# Patient Record
Sex: Female | Born: 1970 | Race: White | Hispanic: No | Marital: Married
Health system: Midwestern US, Academic
[De-identification: ages and names within clinical notes are randomized; demographics above are authoritative.]

---

## 2016-01-25 IMAGING — CR LOW_EXM
2 series · 2 of 2 positions shown · non-contrast
Comparison: none

[foot]
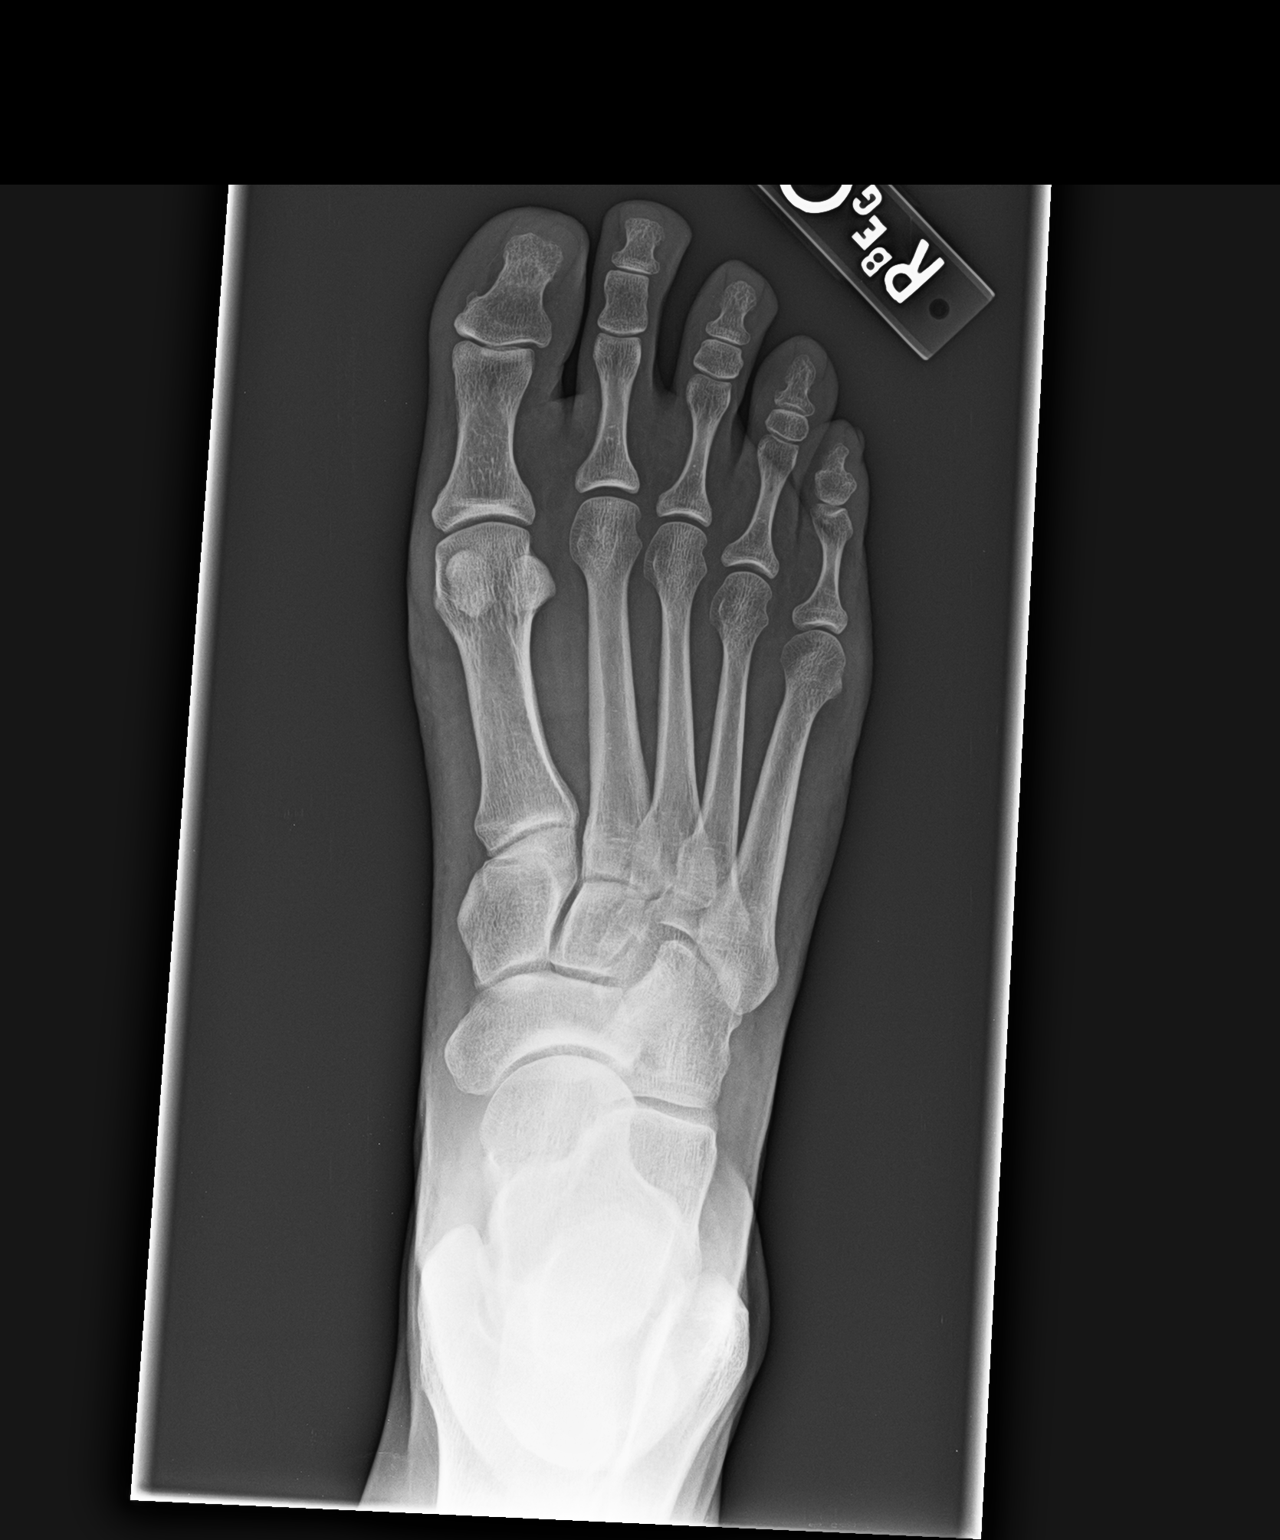

[foot lat]
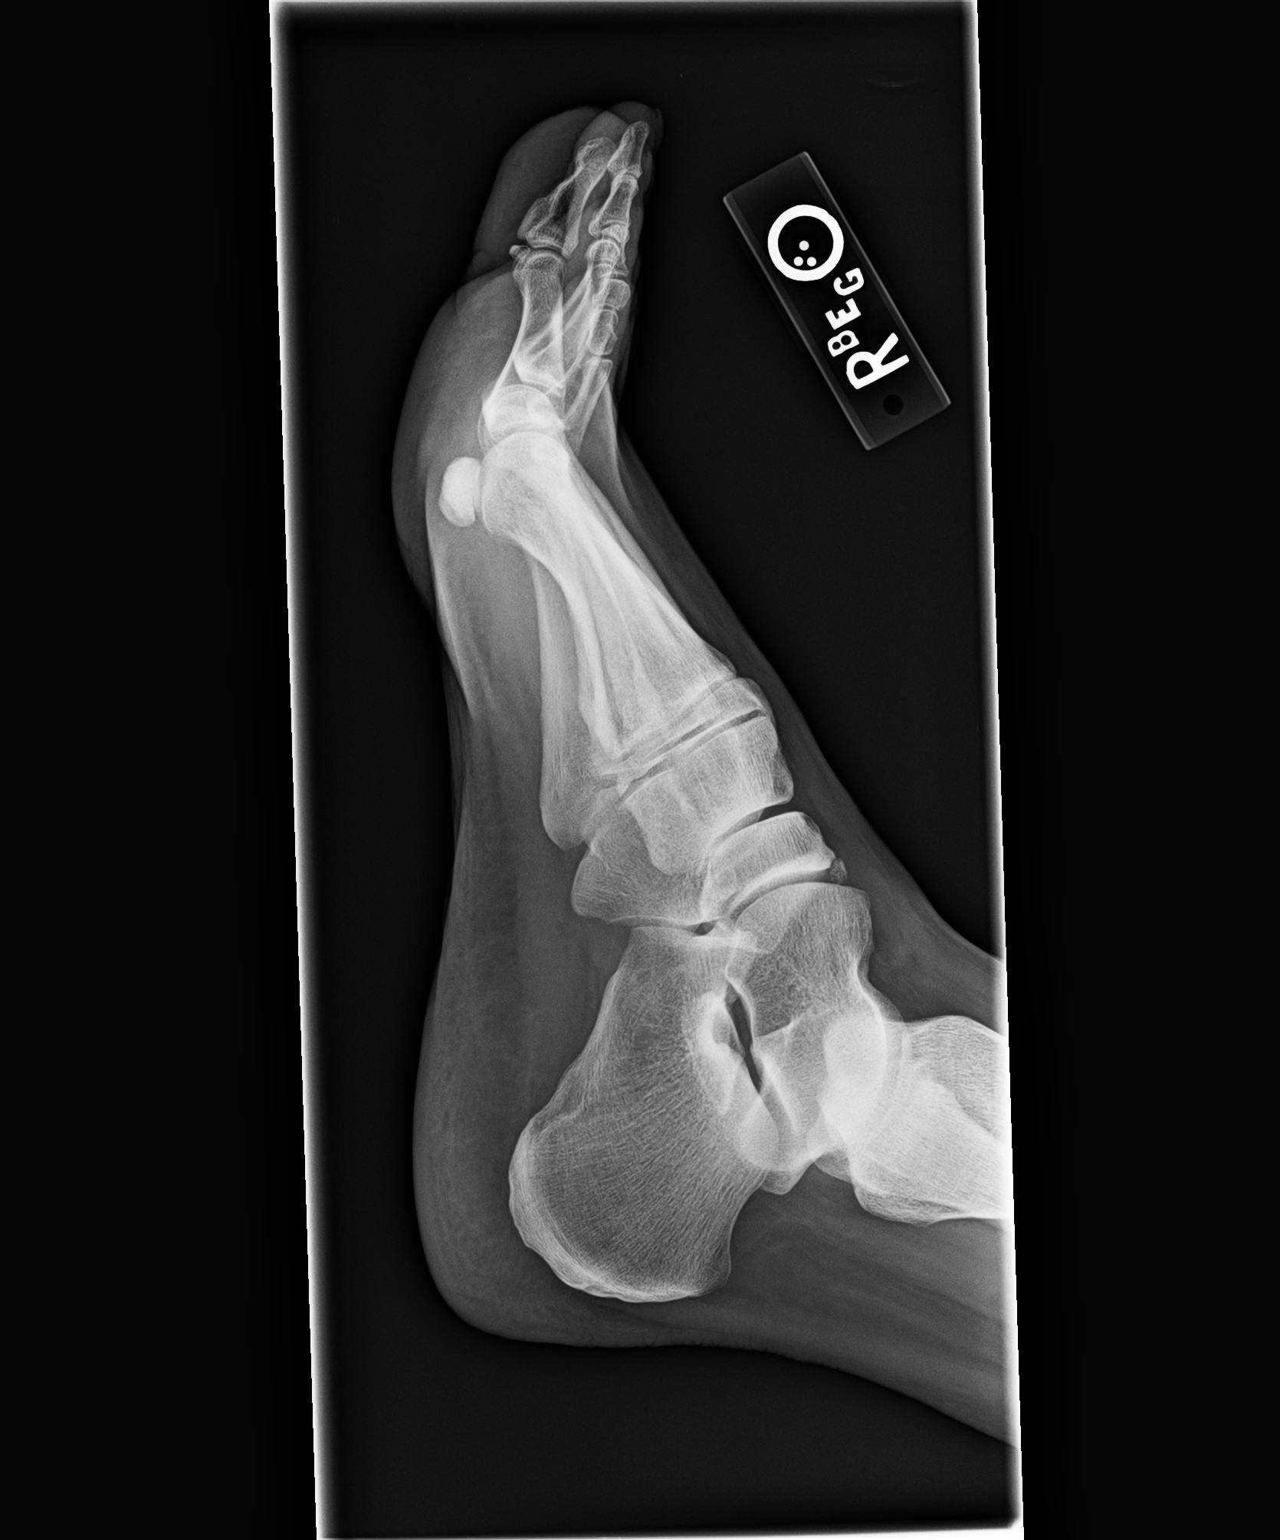

[2 of 2 positions shown; findings below may reference images not displayed]

DIAGNOSTIC STUDIES

EXAM

TWO VIEW RIGHT FOOT

INDICATION

RT FOOT PAIN, RAN OVER WITH JHANDEL 3 WKS AGO
Pain lateral aspect of foot after having it ran over by Irish Barba 2 weeks
ago. Bruising along 5th metatarsal region.
BG

TECHNIQUE

AP and lateral right foot views

COMPARISONS

None

FINDINGS

No dislocation nor fracture is identified. The bony structures have a normal appearance.

IMPRESSION

Two views of the right foot demonstrate no acute bony injury.

## 2016-02-27 IMAGING — CR LOW_EXM
3 series · 3 of 3 positions shown · non-contrast
Comparison: none

[foot]
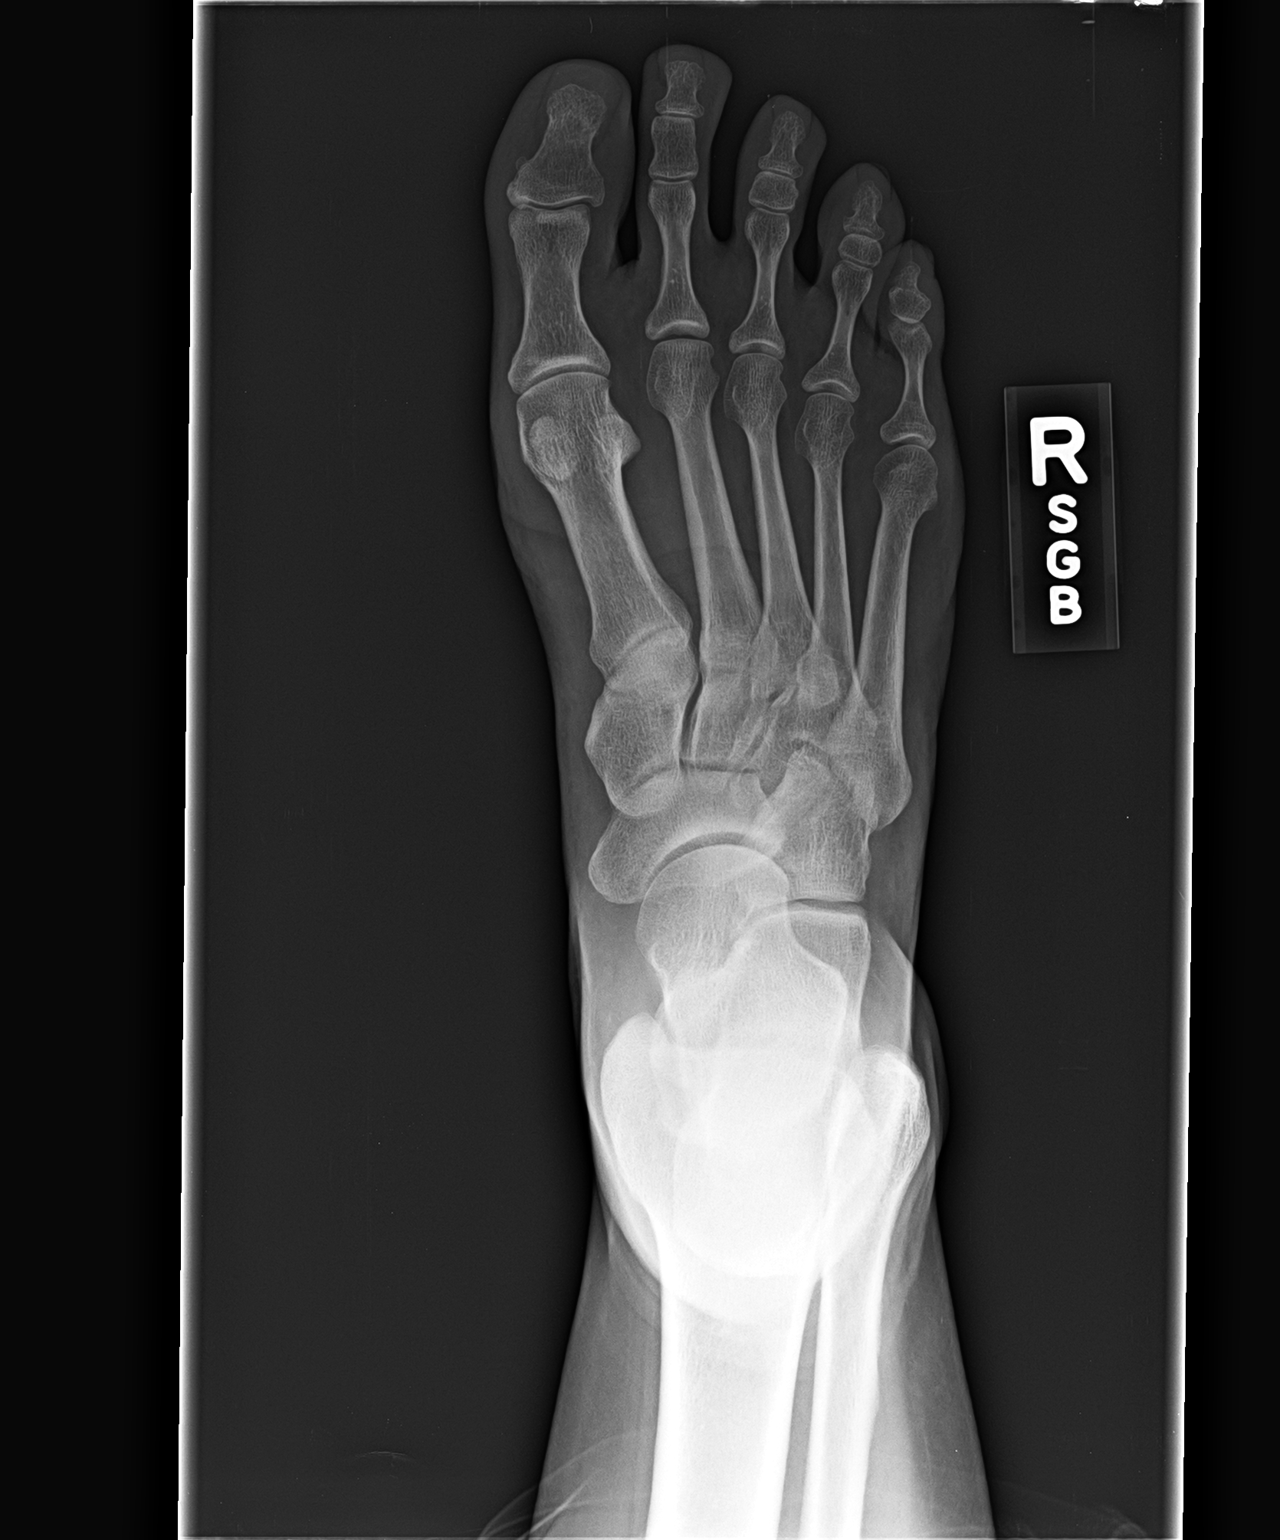

[foot obl]
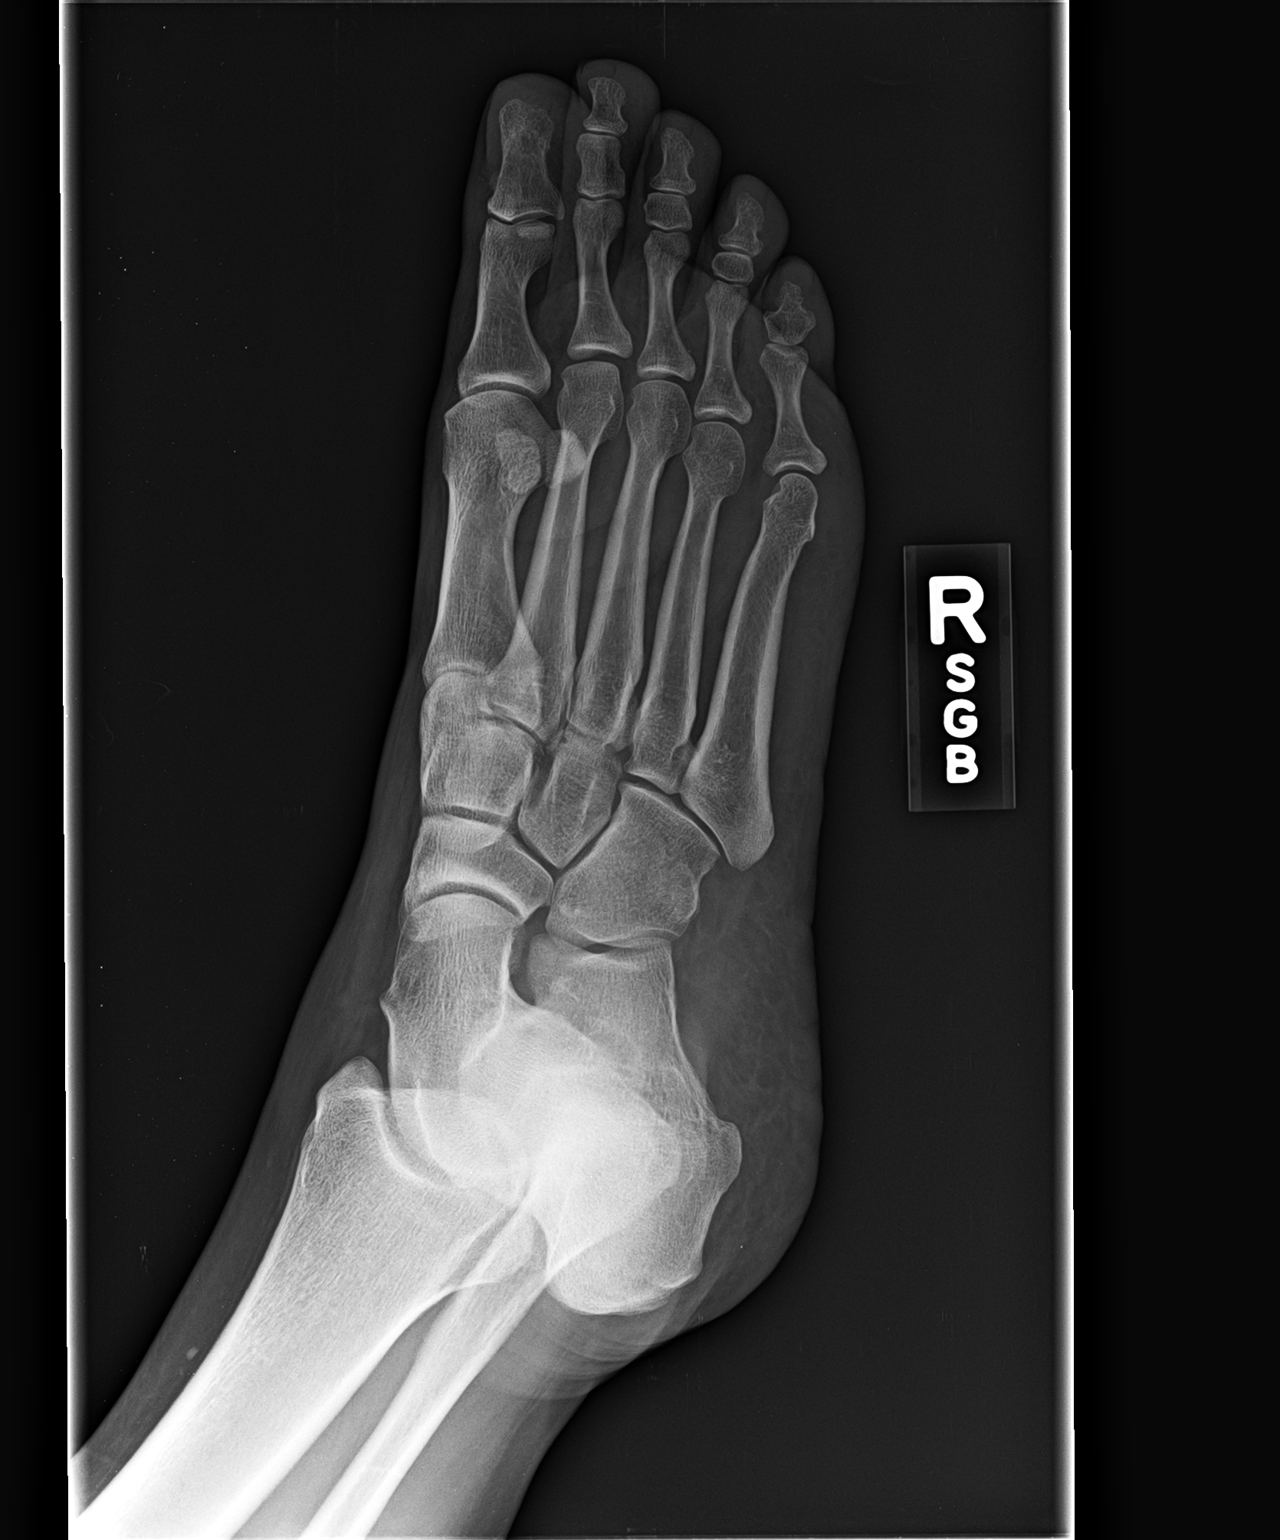

[foot lat]
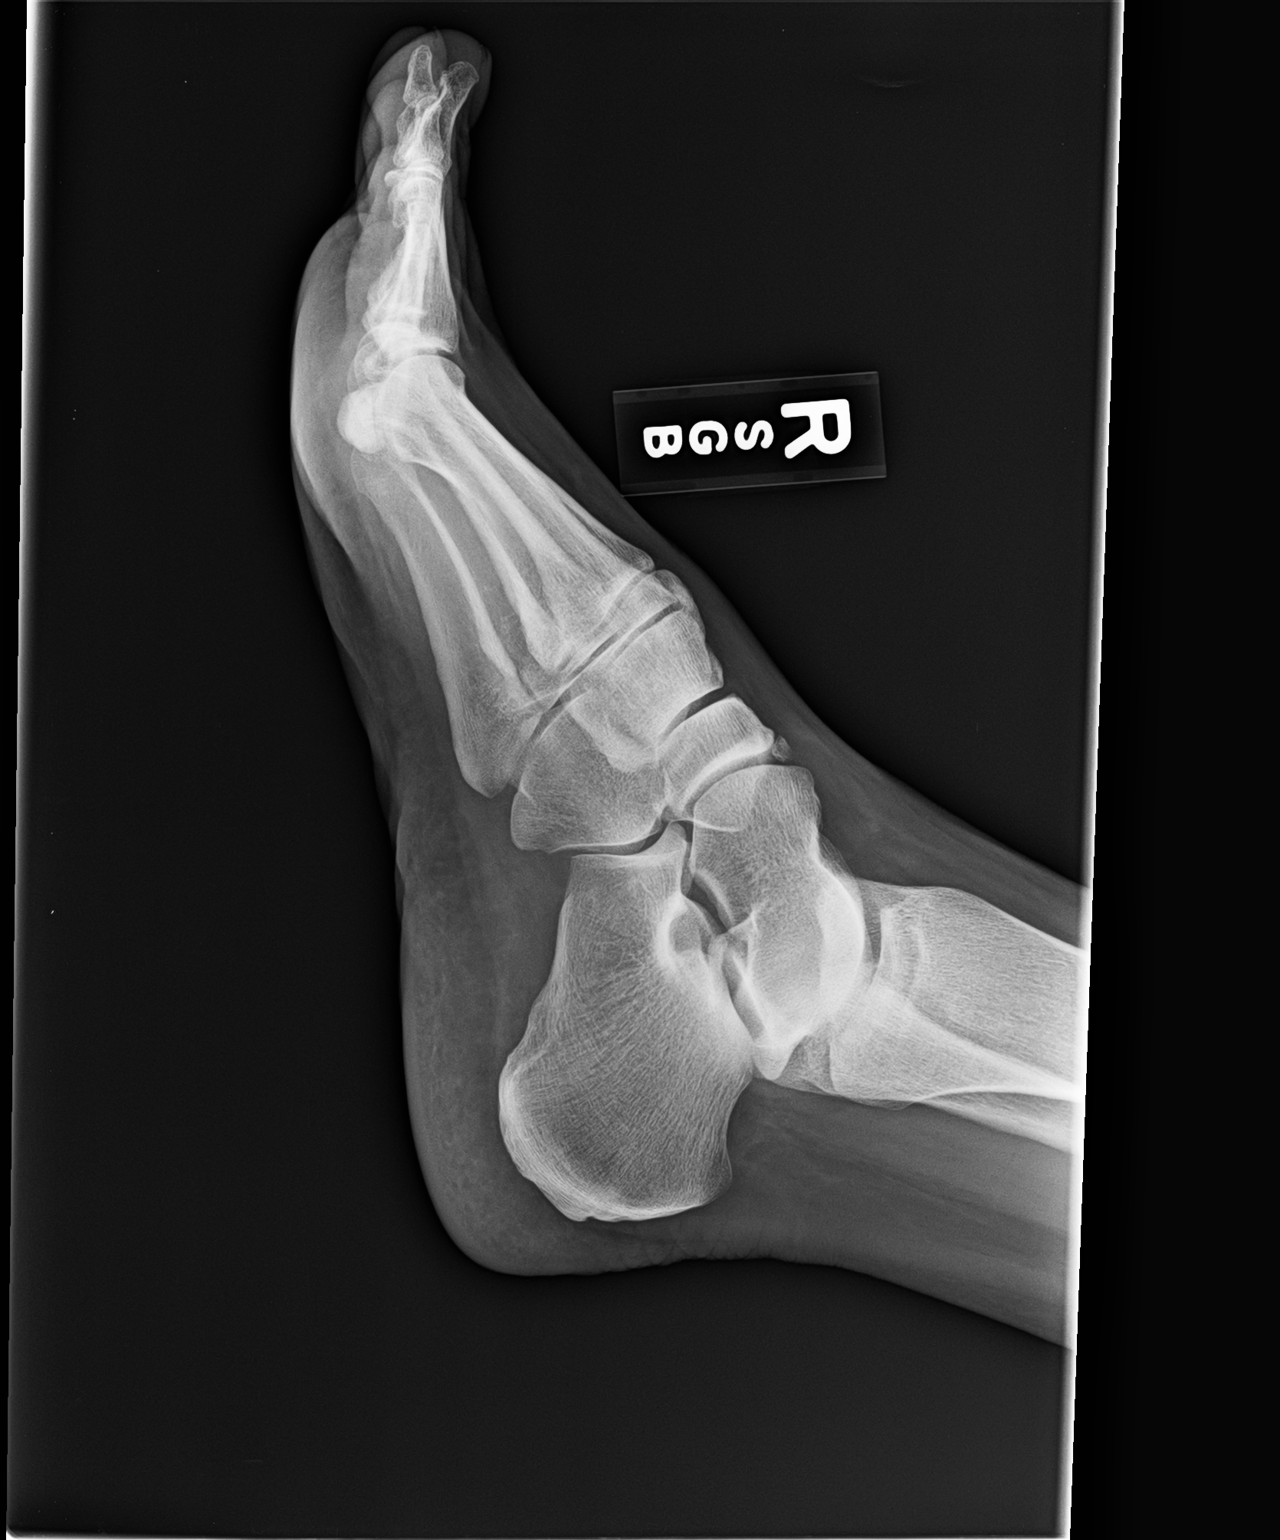

[3 of 3 positions shown; findings below may reference images not displayed]

EXAM
3 views right foot

INDICATION
DROPPED HEQUET ON FOOT 01/07/16
DROPPED GOMILSON KESONGO ON FOOT 01/07/16. PAIN. SB/ME

TECHNIQUE
AP, lateral, and oblique views were obtained

COMPARISONS
None available.

FINDINGS
There is no acute fracture, dislocation, or other acute osseus abnormality. The soft tissues are
unremarkable.

IMPRESSION
1. No acute osseus abnormality.

## 2016-10-31 IMAGING — CR ABDOMEN
2 series · 2 of 2 positions shown · non-contrast
Comparison: none

[abdomen upright]
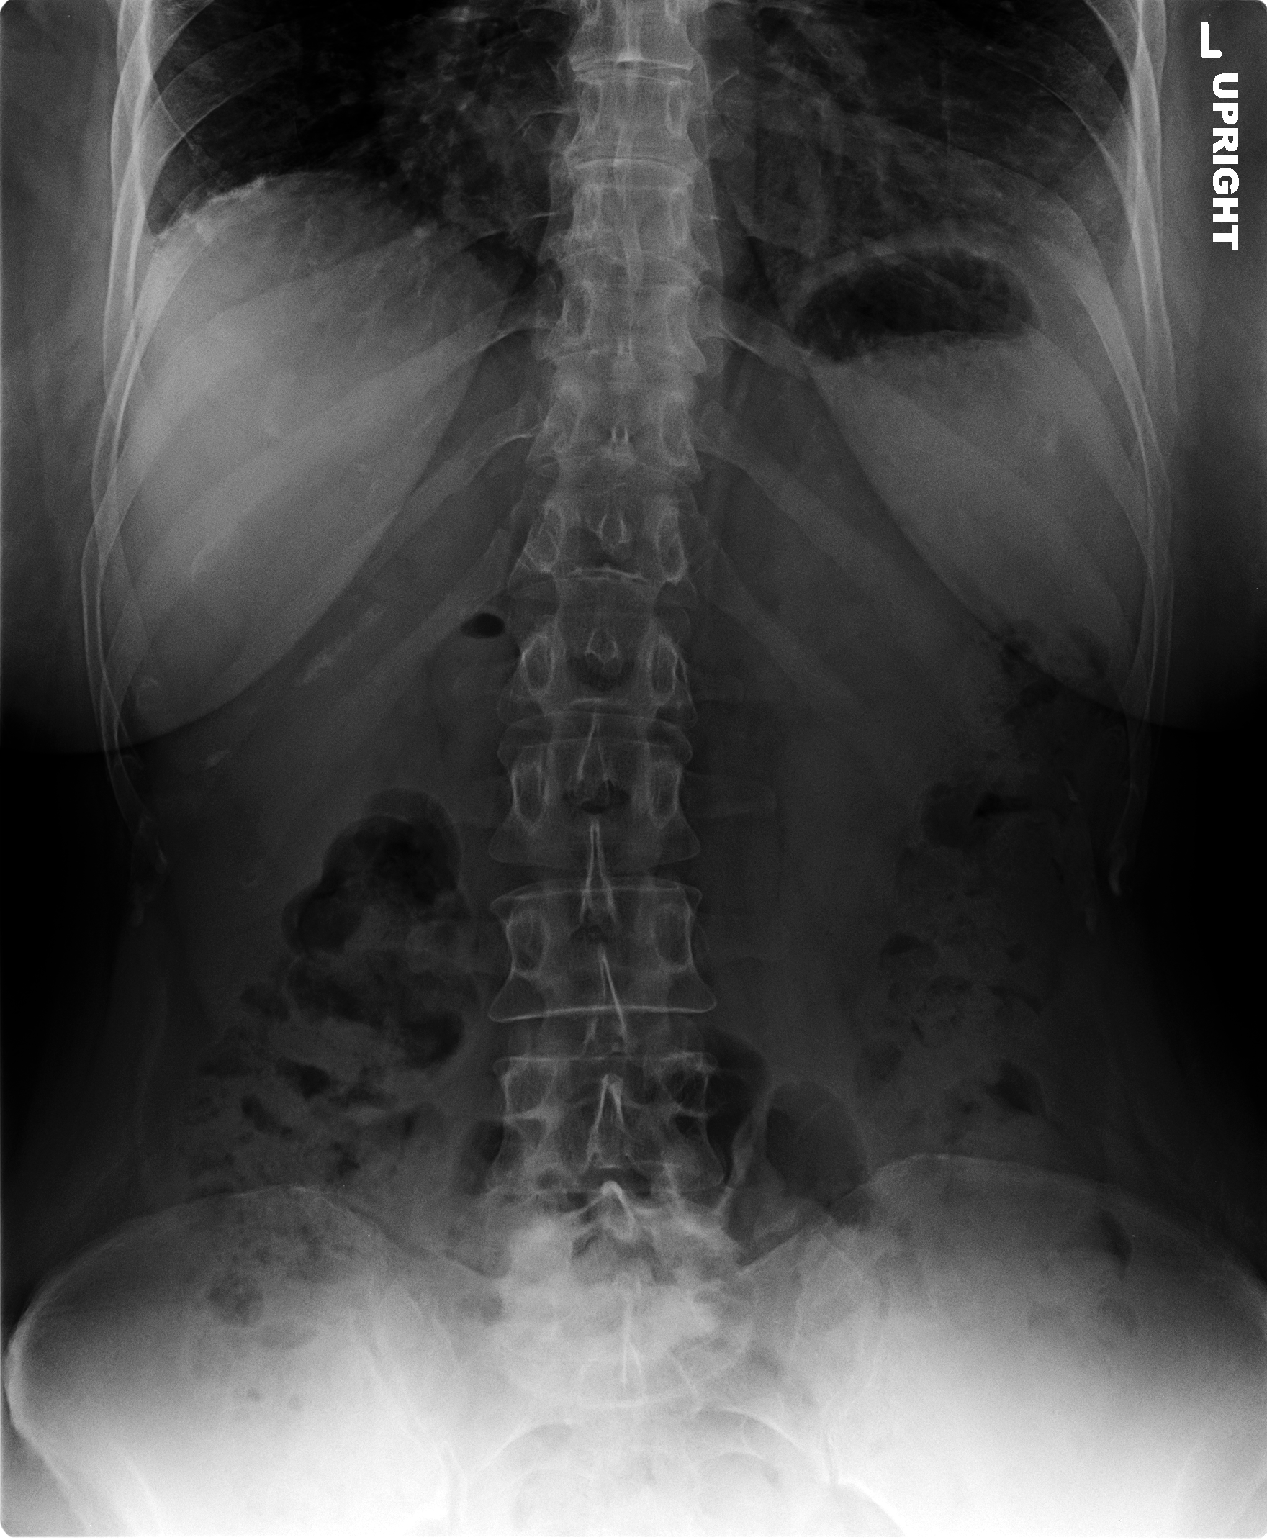

[abdomen supine kub]
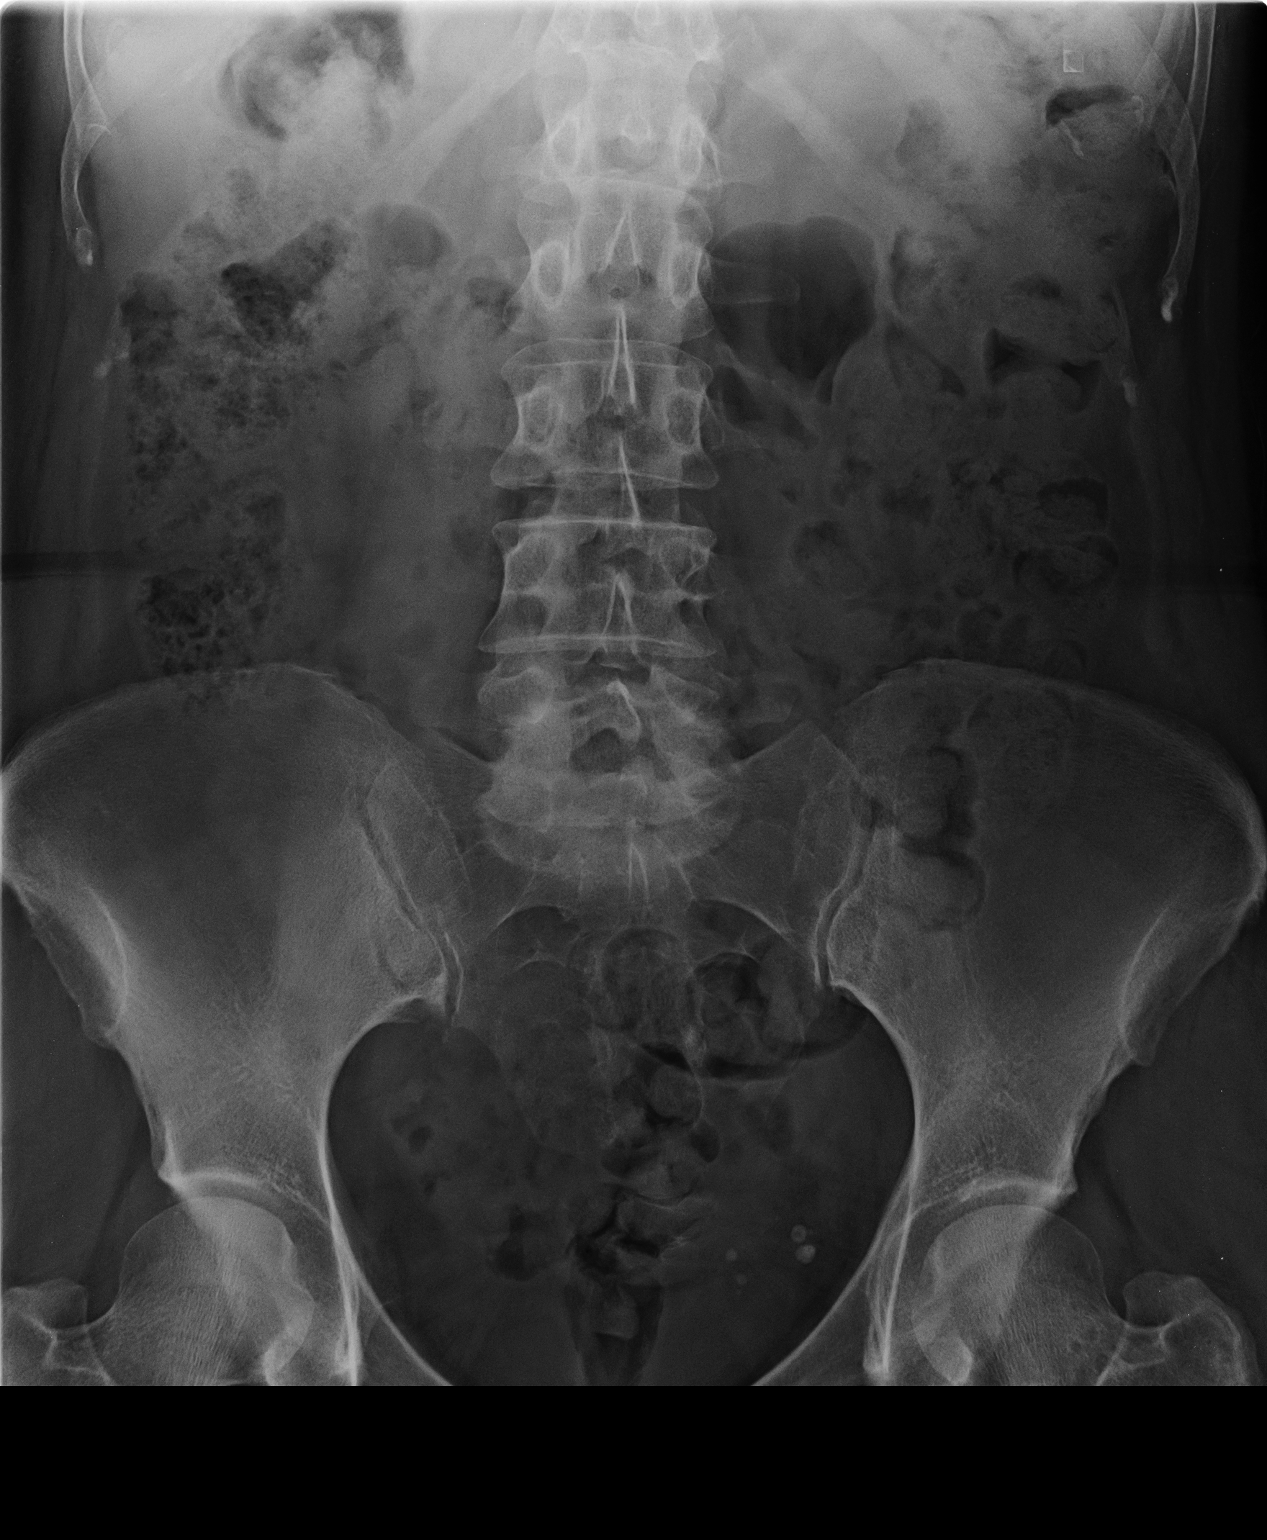

[2 of 2 positions shown; findings below may reference images not displayed]

DIAGNOSTIC STUDIES

EXAM

TWO-VIEW ABDOMEN

INDICATION

constipation, bloating
ABD PAIN, CRAMPING X 2 WEEKS, WORSE THIS WEEK. CONSTIPATION. PT STATES
BEGAN ALLERGY SHOTS FOR FOOD ALLERGIES 1 MO AGO. D/C THIS WEEK DUE TO ABD
PAIN. AB

TECHNIQUE

Upright and supine frontal radiographs of the abdomen and pelvis were performed.

COMPARISONS

None

FINDINGS

The lung bases are clear. No free intraperitoneal gas is detected. The bowel gas pattern is
nonspecific and nonobstructive with stool and gas overlying the colon and rectum. No abnormal
calcifications are seen. Large retained stool volume.

IMPRESSION

Nonspecific bowel gas pattern and no evidence of free intraperitoneal gas. Large retained stool
volume.

## 2018-02-04 IMAGING — MG MAMMOGRAM 3D SCREEN, BILATERAL
12 of 16 series · 12 of 16 positions shown · non-contrast
Comparison: none

[R CC (1 of 2)]
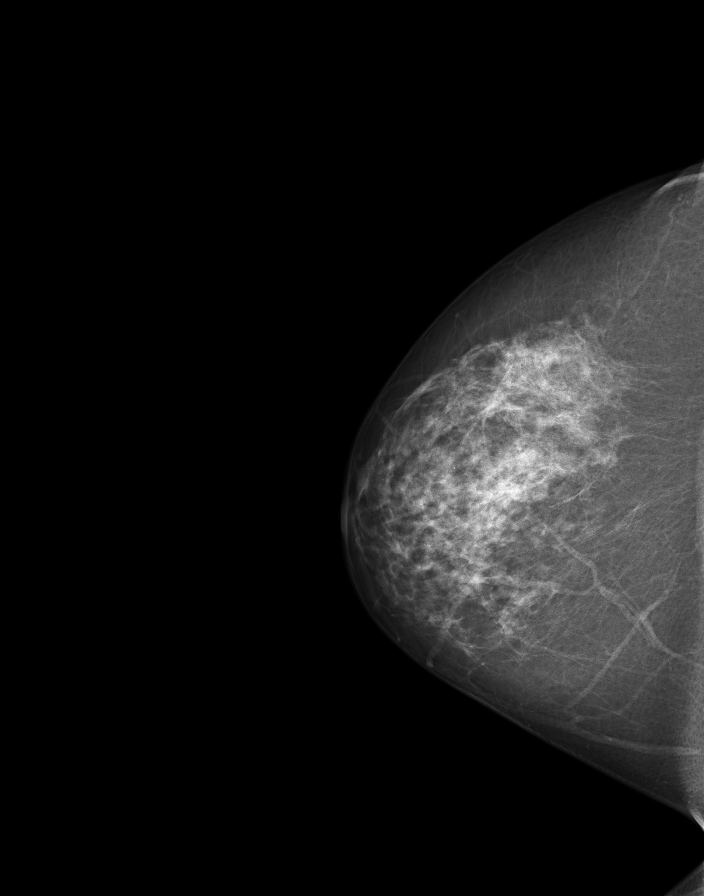

[R tomo (1 of 2)]
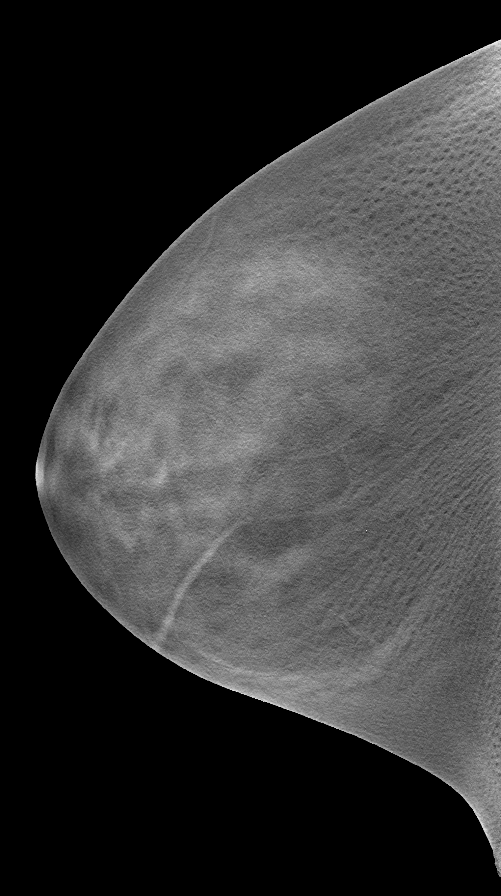

[R CC (2 of 2)]
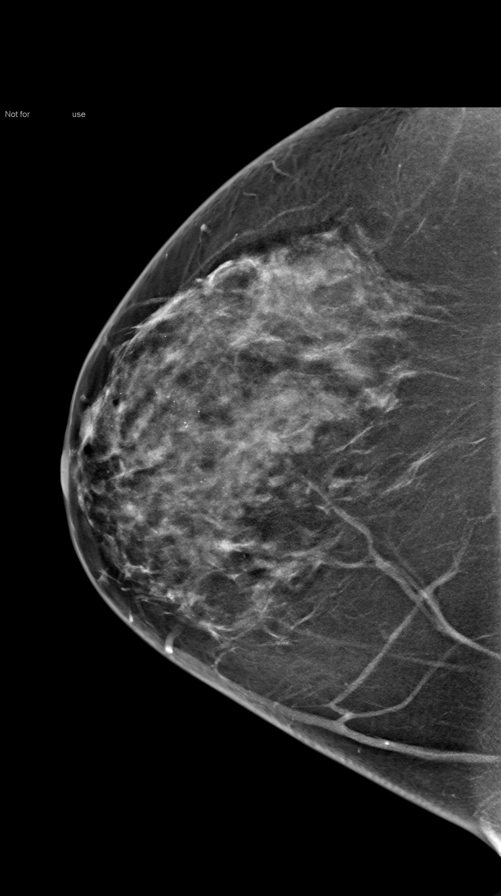

[R (1 of 2)]
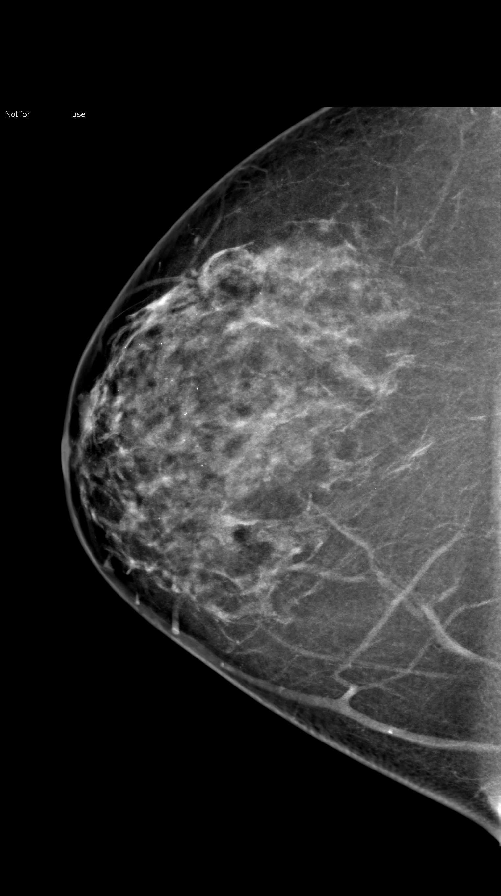

[L CC (1 of 2)]
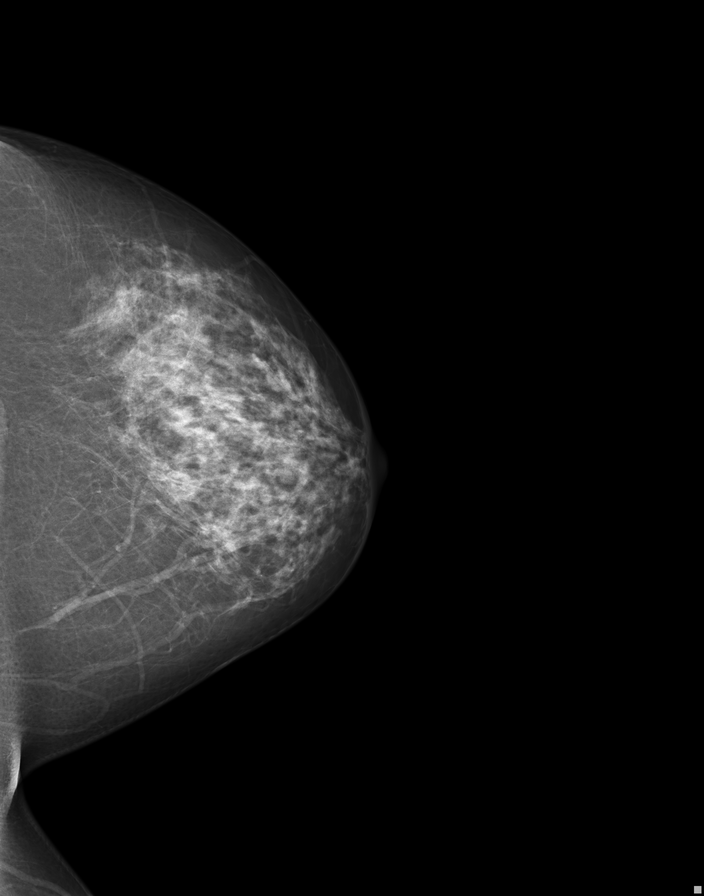

[L tomo]
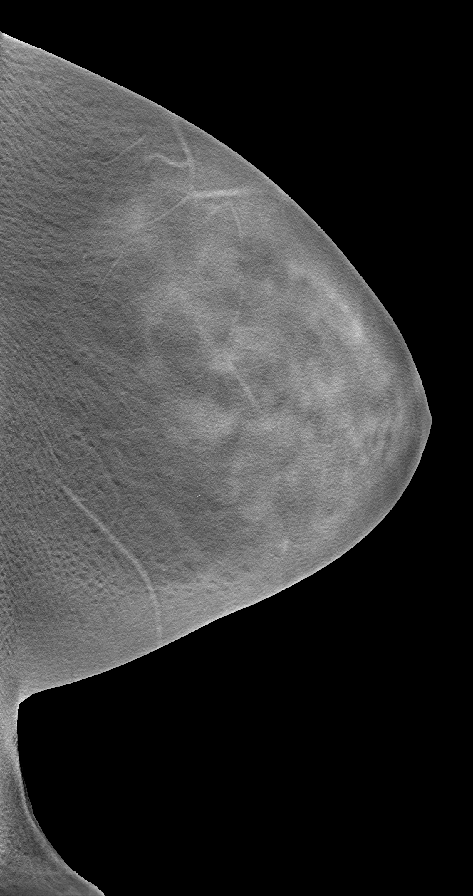

[L CC (2 of 2)]
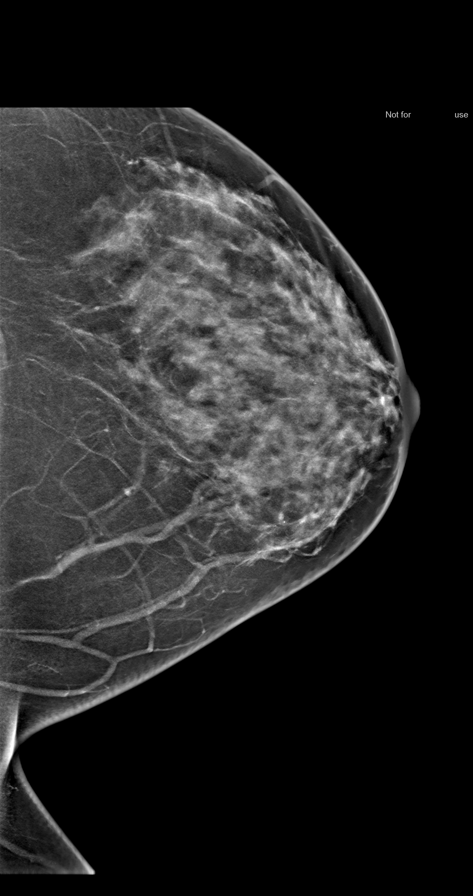

[L]
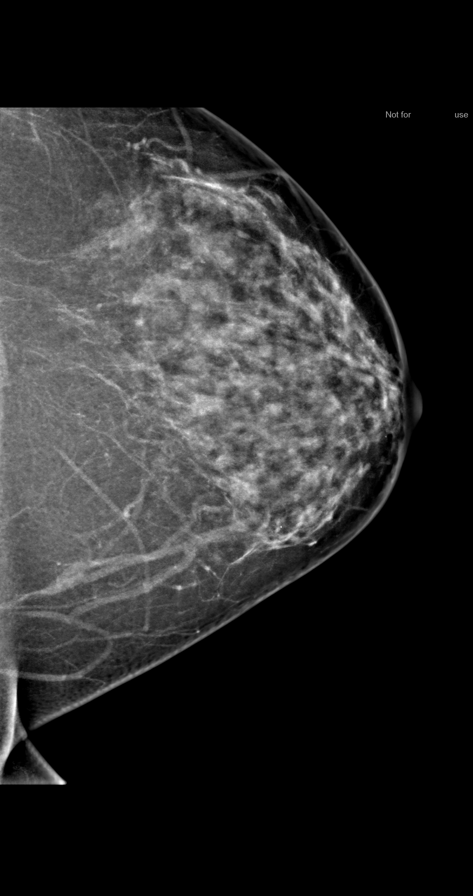

[R MLO (1 of 2)]
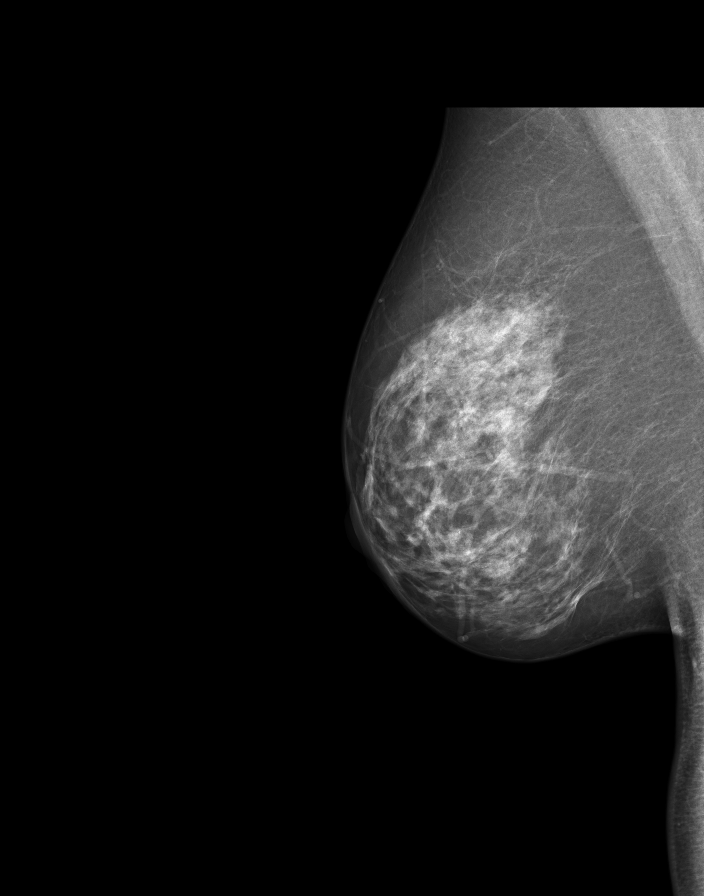

[R tomo (2 of 2)]
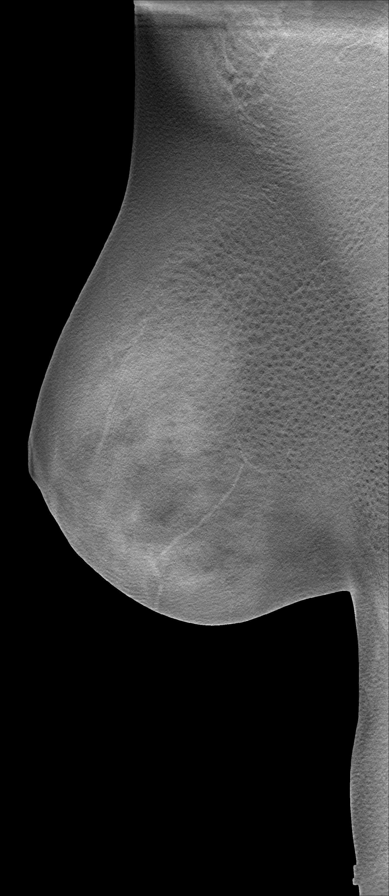

[R MLO (2 of 2)]
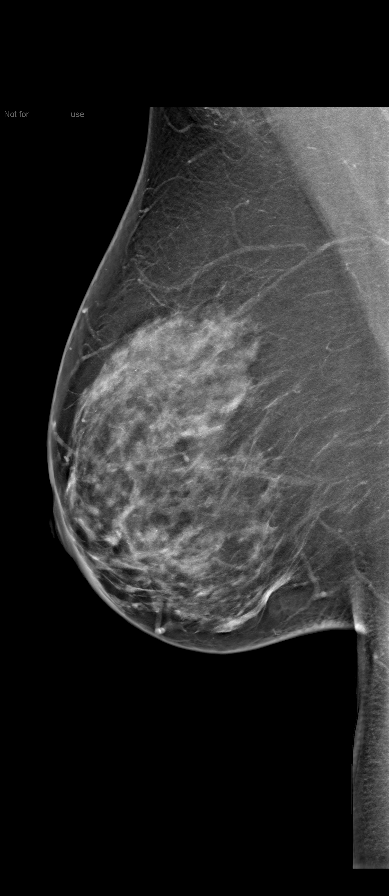

[R (2 of 2)]
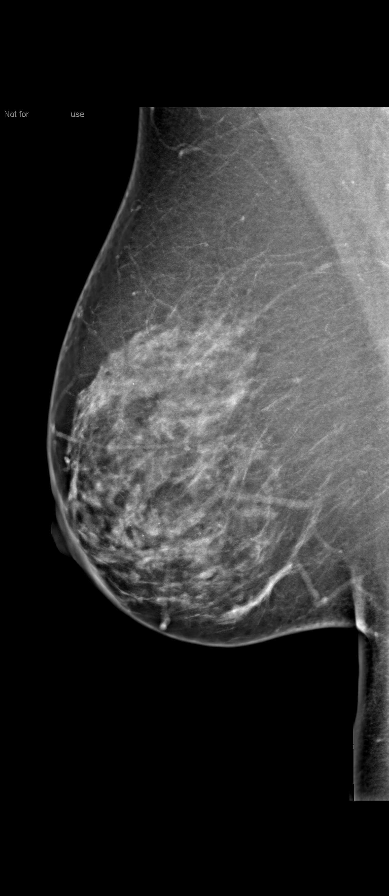

[12 of 16 positions shown; findings below may reference images not displayed]

EXAM
MM mammogram 3D screen bilat

INDICATION
Screening

TECHNIQUE
2D and 3D tomosynthesis digital craniocaudal and mediolateral oblique views were obtained of both
breasts. Computer aided detection software was utilized.

COMPARISONS
None available at the time of dictation.

FINDINGS
The breasts are heterogeneously dense.

No suspicious mass, architectural distortion, microcalcification, or asymmetry.

Unchanged scattered diffuse punctate calcifications, which are likely benign.

IMPRESSION
BI-RADS 2, BENIGN.
A reminder letter will be sent.

Tech Notes:

## 2018-08-19 IMAGING — CR UP_EXM
3 series · 3 of 3 positions shown · non-contrast
Comparison: none

[elbow ap]
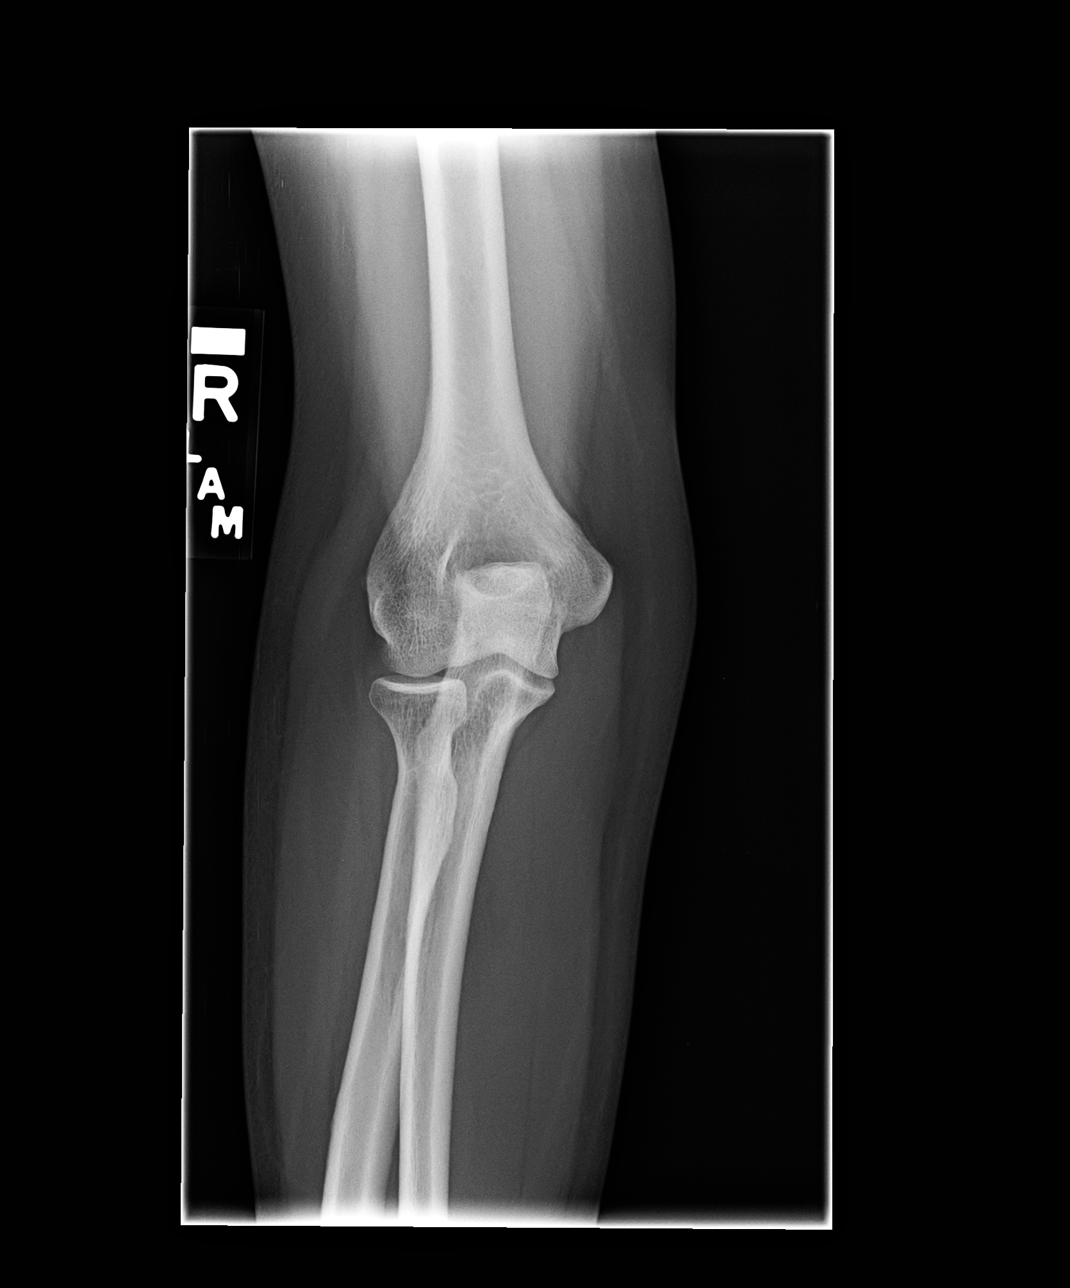

[elbow obl]
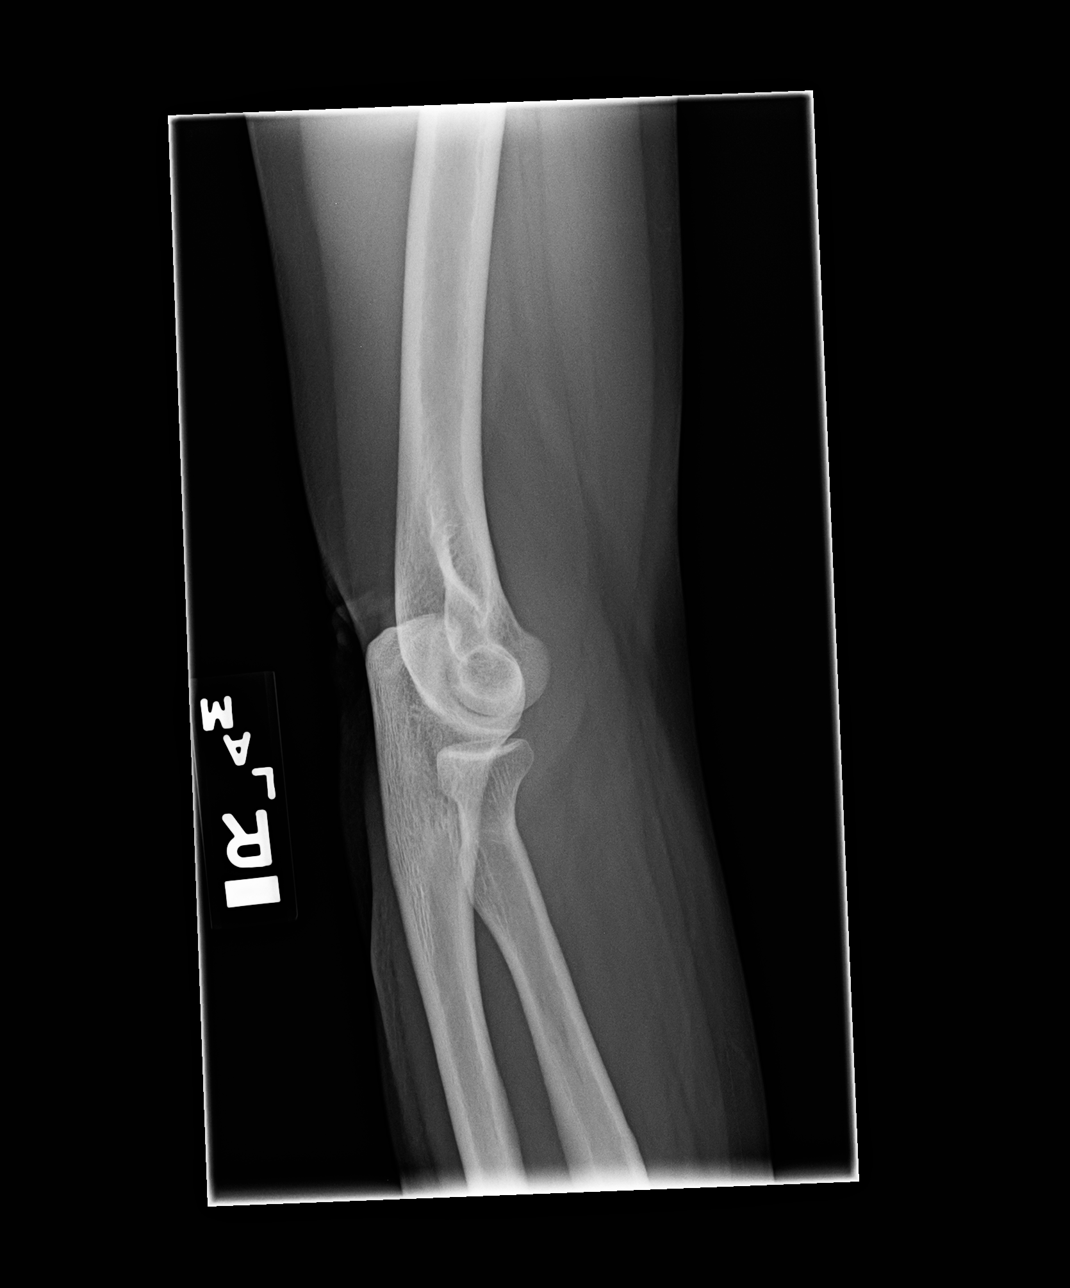

[elbow lat]
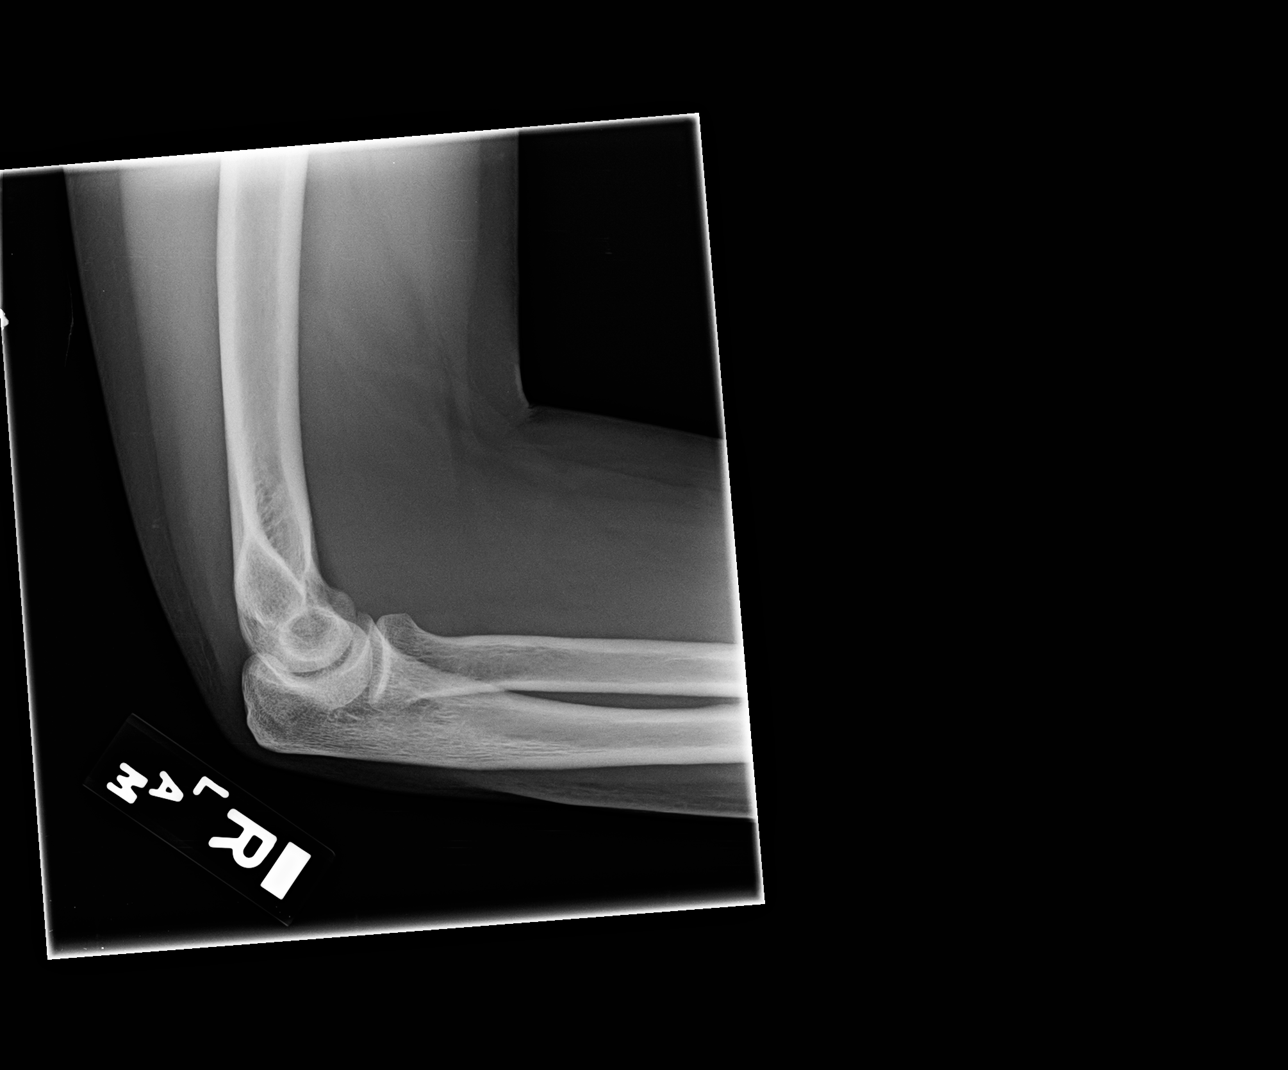

[3 of 3 positions shown; findings below may reference images not displayed]

EXAM

RADIOLOGICAL EXAMINATION, ELBOW; 3 VIEWS OR MORE COMPLETE CPT 28020

INDICATION

PAIN IN THE RIGHT ELBOW. NKI. LM

TECHNIQUE

3 views of the right elbow were acquired.

COMPARISONS

There are no previous examinations available for comparison at the time of dictation.

FINDINGS

There are no fractures or subluxations of the right elbow. There are no abnormal masses or
calcifications. There are no blastic or lytic lesions.

IMPRESSION

No acute radiographic abnormalities of the right elbow.

Tech Notes:

PAIN RT ELBOW. NKI. LM

## 2018-11-13 ENCOUNTER — Encounter: Admit: 2018-11-13 | Discharge: 2018-11-13

## 2018-11-13 ENCOUNTER — Ambulatory Visit: Admit: 2018-11-13 | Discharge: 2018-11-14

## 2018-11-13 DIAGNOSIS — M7711 Lateral epicondylitis, right elbow: Secondary | ICD-10-CM

## 2018-11-13 DIAGNOSIS — E039 Hypothyroidism, unspecified: Secondary | ICD-10-CM

## 2018-11-13 NOTE — Progress Notes
Date of Service: 11/13/2018     Subjective:             Danielle Garner is a 48 y.o. female.    History of Present Illness  Mrs. Orie Rout is a 48 year old female with hypothyroidism and anxiety who presents today for initial consultation for right lateral and medial elbow pain. Pain started in January after lifting some packages over the holiday. At this time lateral >medial, and patient saw her PCP approximately 2 months ago who provided a steroid injection over the extensor tendons. Since this time she has had approximately 90% relief but has noticed an increase in her medial elbow pain. Quality is achy and tight with intermittent sharp/shooting pain to ipsilateral shoulder and wrist.  Provoking factors include elbow movement, typing, using mouse. Palliating factors include ice/heat, stretching, massage, steroid injection. Has also tried voltaren gel without relief. Tried elbow strap without relief. Completed occupational therapy for 1 month, approx 12 sessions, which was mildly helpful. She has also seen a Land. She is interested in possible Tenex in the future if symptoms worsen or recur.        Review of Systems   Musculoskeletal: Positive for arthralgias.   Allergic/Immunologic: Positive for environmental allergies and food allergies.   All other systems reviewed and are negative.    Past Medical History:    has a past medical history of Hypothyroidism.    Past Surgical History:    has a past surgical history that includes tenotomy (Left, 02/08/2016) and Lung biopsy (Right, 02/03/2009).    Social History:   Social History     Socioeconomic History   ??? Marital status: Married     Spouse name: Not on file   ??? Number of children: Not on file   ??? Years of education: Not on file   ??? Highest education level: Not on file   Occupational History   ??? Not on file   Tobacco Use   ??? Smoking status: Never Smoker   ??? Smokeless tobacco: Never Used   Substance and Sexual Activity   ??? Alcohol use: Yes Drinks per session: 1 or 2     Binge frequency: Weekly   ??? Drug use: Not on file   ??? Sexual activity: Not on file   Other Topics Concern   ??? Not on file   Social History Narrative   ??? Not on file       Family History:  No family history on file.    Allergies:  Allergies   Allergen Reactions   ??? Asa [Aspirin] SEE COMMENTS     Nose bleeds                 Objective:         ??? cyclobenzaprine (FLEXERIL) 10 mg tablet Take 10 mg by mouth at bedtime daily.   ??? levothyroxine (SYNTHROID) 75 mcg tablet Take 75 mcg by mouth daily 30 minutes before breakfast.   ??? naltrexone-bupropion (CONTRAVE) 8-90 mg tablet Take 2 tablets by mouth twice daily.     Vitals:    11/13/18 0853   BP: 110/78   BP Source: Arm, Left Upper   Patient Position: Sitting   Pulse: 78   Resp: 12   Temp: 36.3 ???C (97.3 ???F)   TempSrc: Oral   Weight: 83.4 kg (183 lb 12.8 oz)   Height: 167.6 cm (66)   PainSc: One     Body mass index is 29.67 kg/m???.  Physical Exam  Ortho Exam  Neurologic Exam     Gen: Alert & Oriented X 3  HEENT: EOMI  Neck: Supple  Heart: Extremities well perfused  Lungs: non labored breathing  Abdomen: Soft, non-distended  Skin: no gross lesions appreciated  Ext: purposeful movement of extremities   MS:   Root Right Left   Shoulder Abduction C5 5 5   Elbow Flexion C5 5 5   Elbow Extension C7 5 5   Wrist Extension C6 5 5   Finger Flexion C8 5 5   Finger Abduction T1 5 5     No erythema, ecchymosis, or effusion appreciated over right elbow.   Adequate cervical range of motion with flexion, extension, side bending and rotation.    Full AROM with bilateral shoulder forward flexion, abduction, internal and external rotation.    Full AROM of right elbow in flexion, extension, pronation and supination.   Mild tenderness of extensor/pronator bundle over right lateral epicondyle in addition to flexor tendons over right medial epicondyle.   Negative Spurling's, empty can, speed, yergason, neers/hawkin on right.  Negative VEO test on right. Neuro:  DTR's 1+ in bilateral biceps, triceps, brachioradialis   Upper Extremity Tone Normal   Upper Extremity Sensation Intact to light touch bilaterally            Assessment and Plan:  Briefly Mrs. Carney is a pleasant 48 year old female with no significant past medical history who presents today as a referral from her PCP for Tenex evaluation for ongoing medial and lateral right elbow pain.  History and physical examination are consistent with right medial and lateral tendinosis.  Patient received a steroid injection approximately 2 months ago over the extensor/pronator bundle with 90% improvement in pain.  Unfortunately, she continues to have mild medial sided pain.  Evaluation with ultrasound today did not reveal pathology amenable to Tenex on the medial/flexor side but did show potential for therapeutic intervention on the lateral/extensor side.  Risks and benefits of the procedure were discussed in detail and patient was instructed to call the clinic should her symptoms worsen or recur in the future to schedule Tenex procedure.      Aura Dials, MD  Physical Medicine and Rehabilitation, PGY3         ATTESTATION    I personally performed the E/M including history, physical exam, and MDM.  Ms. Flagler initial right lateral and later right medial elbow pain has mostly subsided by this point. She has no medial TTP and mild lateral TTP over the proximal tip of the right lateral epicondyle. Sonography demonstrates very small enthesophytes at the origin of the right extensor tendon bundle. Some echogenic deposits are visible in the tendon 1.5cm distal to the tendon origin and would be consistent with prior steroid injection. No hypoechogenic foci in the medial or lateral tendon origins, nor fluid or hyperemia. Given that she is doing well currently, she will follow-up with [place] for PUT of the lateral or recommendations for eccentric exercise and re-evaluation with ultrasound if medial elbow pain. Staff name:  Blenda Peals, MD Date:  11/13/2018

## 2019-02-16 IMAGING — MG MAMMOGRAM 3D SCREEN, BILATERAL
11 of 15 series · 11 of 15 positions shown · non-contrast
Comparison: none

[R CC]
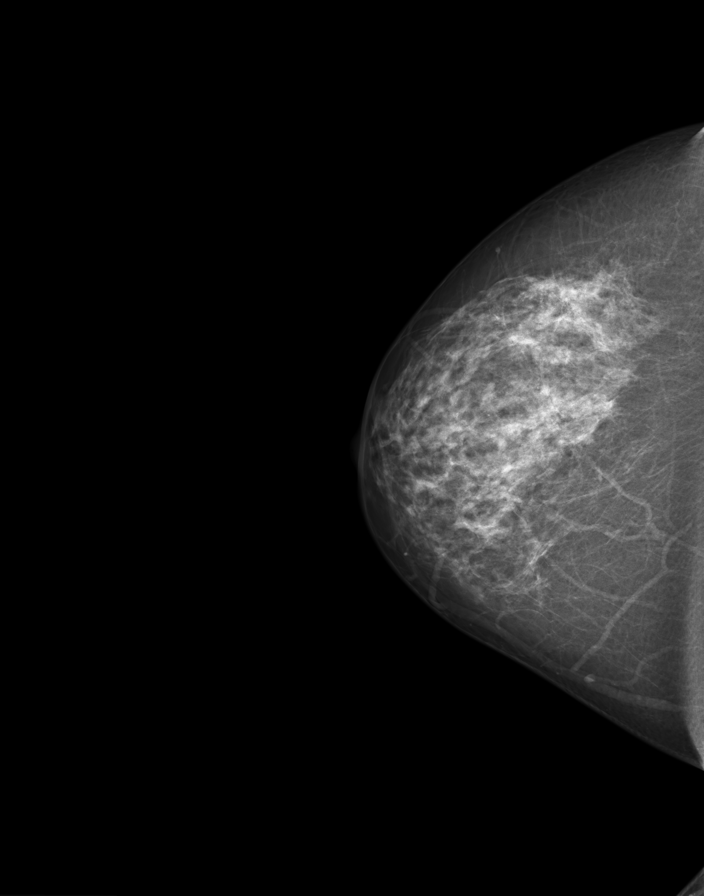

[R tomo (1 of 2)]
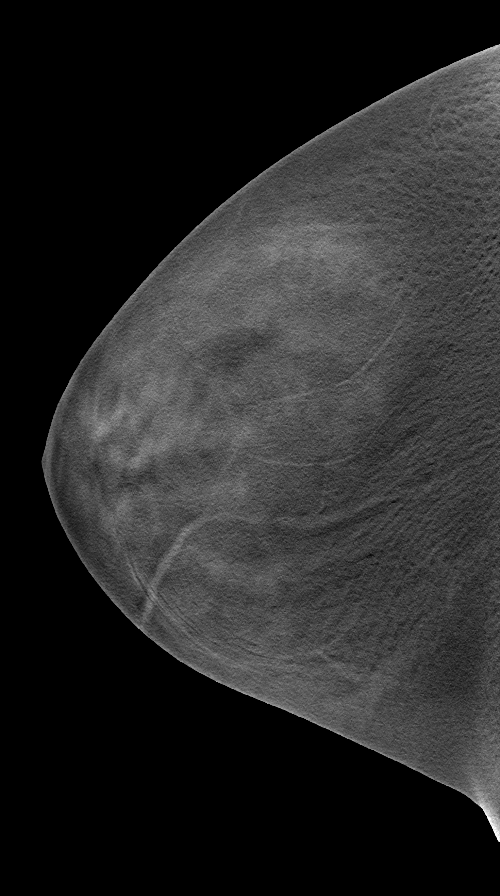

[R (1 of 2)]
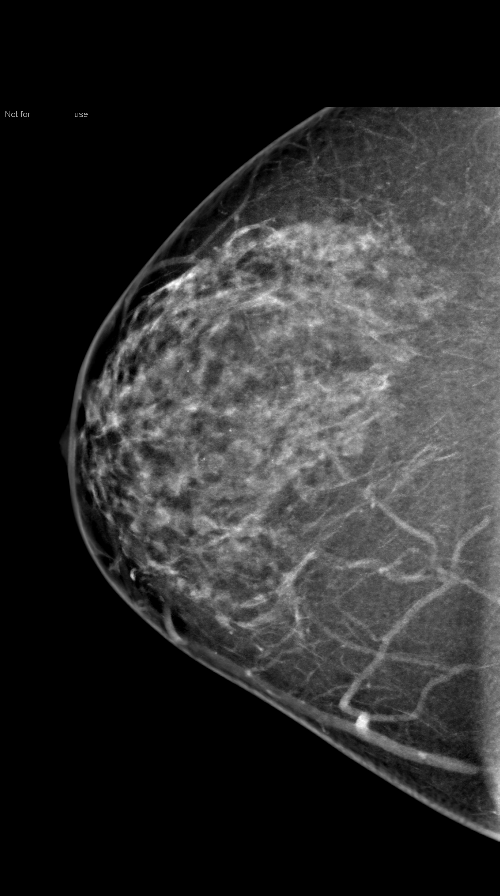

[L CC (1 of 2)]
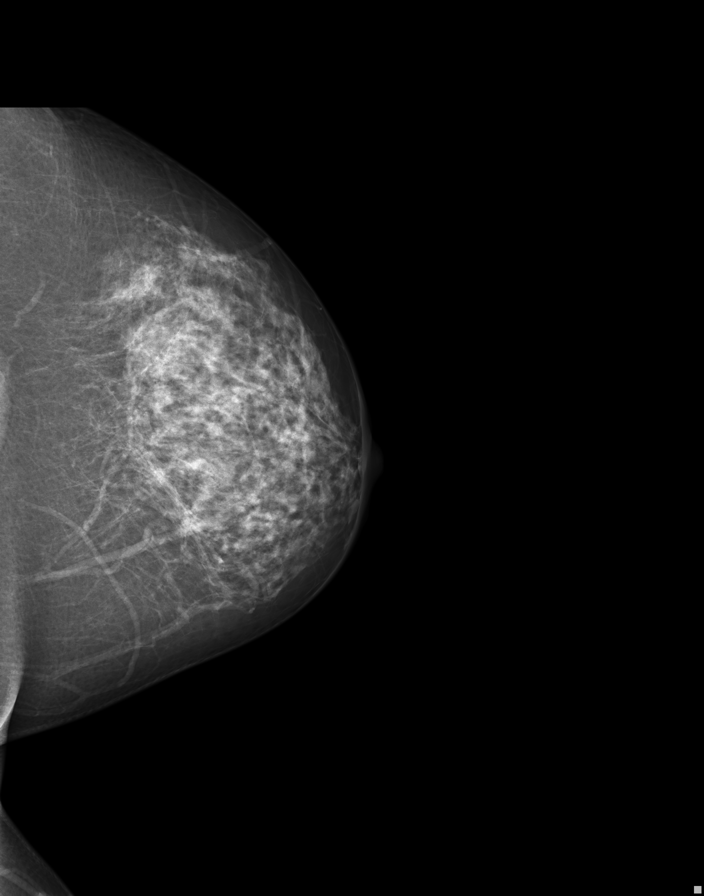

[L tomo]
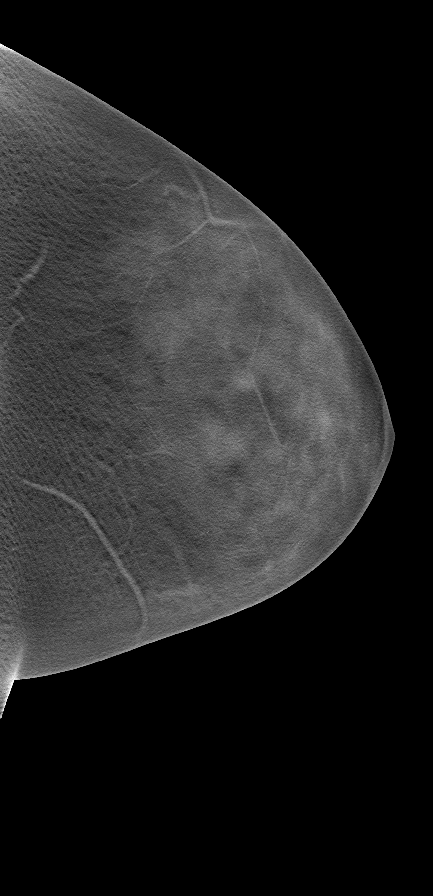

[L CC (2 of 2)]
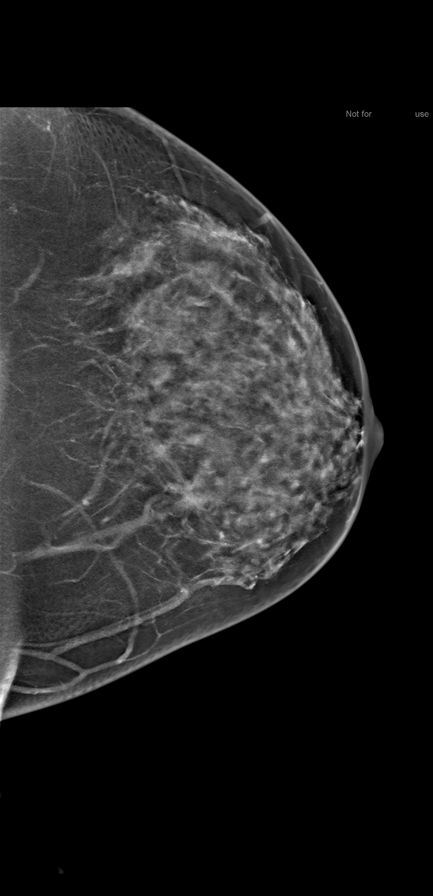

[L]
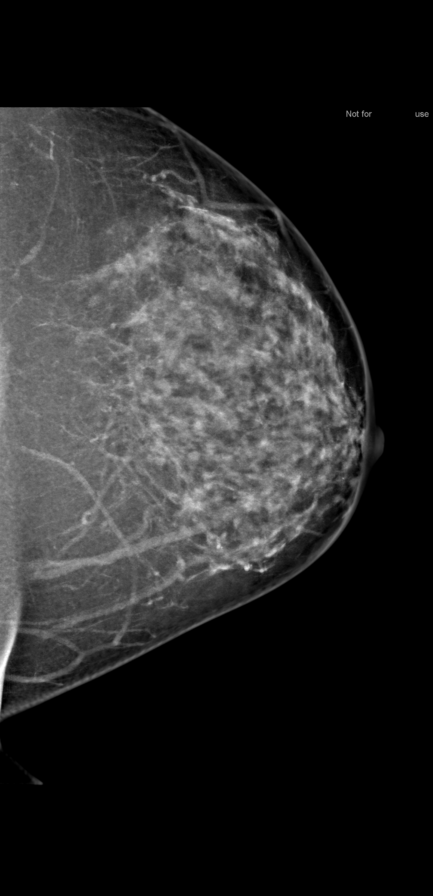

[R MLO (1 of 2)]
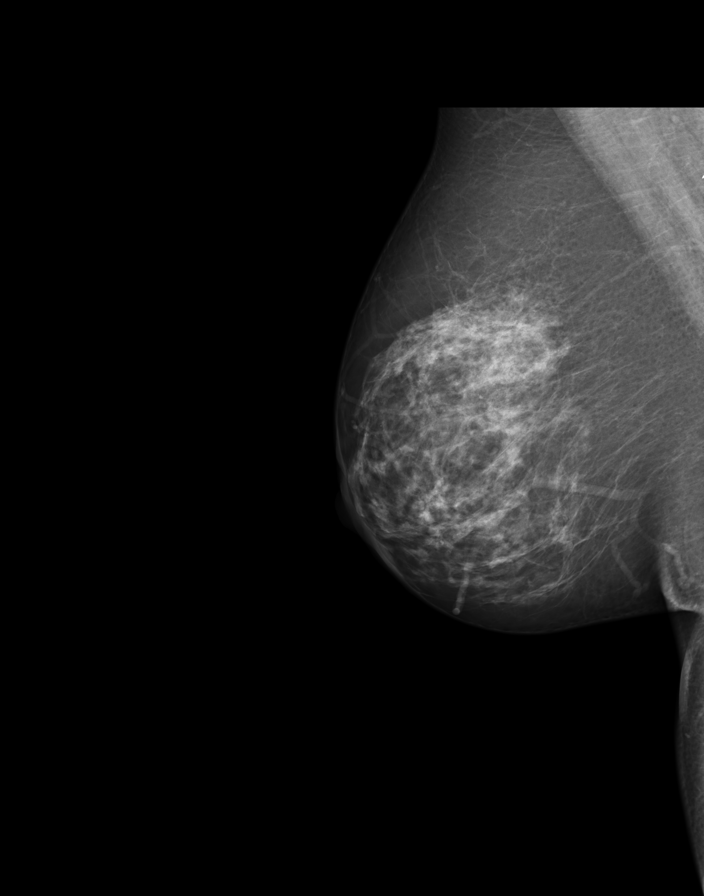

[R tomo (2 of 2)]
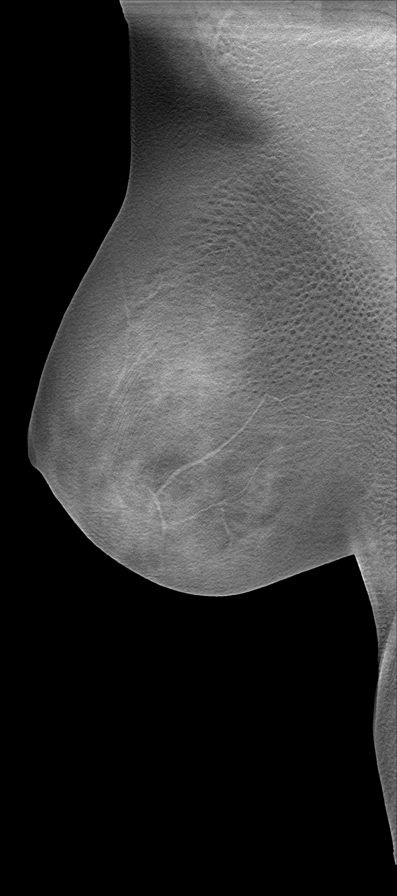

[R MLO (2 of 2)]
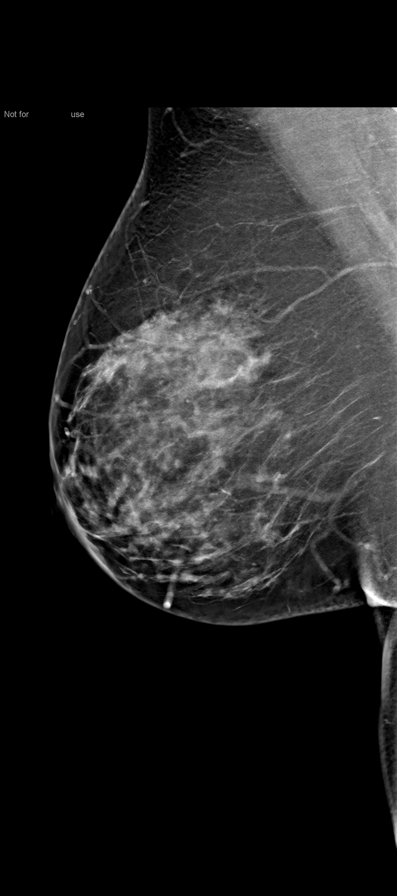

[R (2 of 2)]
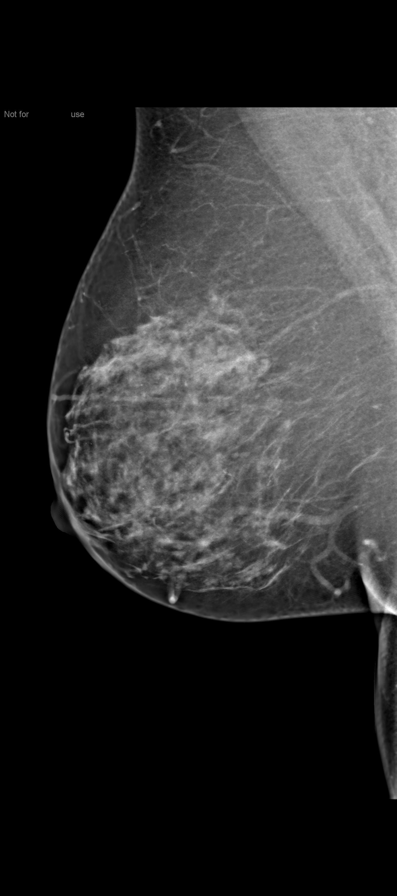

[11 of 15 positions shown; findings below may reference images not displayed]

DIAGNOSTIC STUDIES

EXAM

Bilateral screening mammogram

INDICATION

screening
NSCR 3D. LM

TECHNIQUE

MLO and CC views, tomosynthesis, CAD

COMPARISONS

February 04, 2018,January 01, 2017

FINDINGS

There is heterogeneous breast parenchyma, which can obscure small masses. There are similar
diffuse calcifications. No spiculated mass.

IMPRESSION

BI-RADS 2. Benign findings. Continued annual screening mammography.

Tech Notes:

## 2019-07-13 IMAGING — MR C-spine^Routine
5 series · 42 of 48 positions shown · non-contrast
Comparison: none

[Series 2: T2 · sagittal · 3.0mm · 0.54mm/px · 7 of 13 slices shown]
[im 1/13]
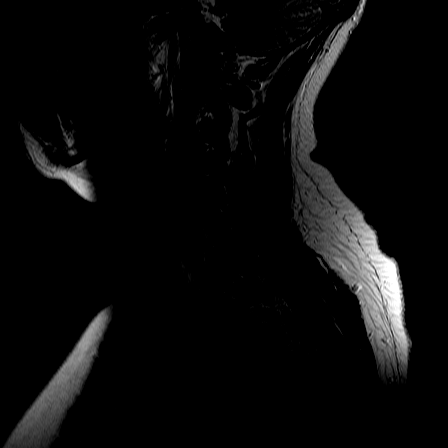
[im 3/13]
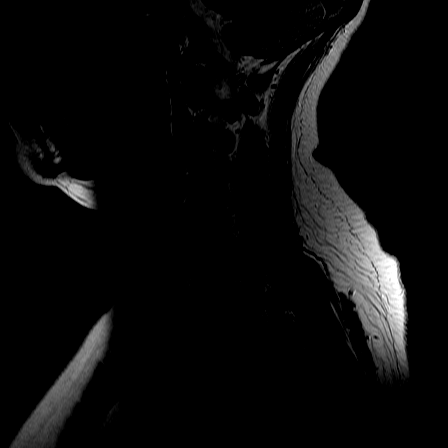
[im 5/13]
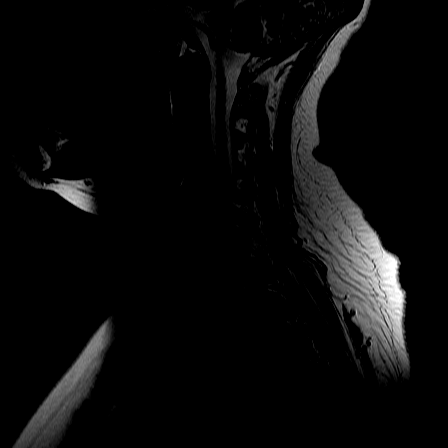
[im 7/13]
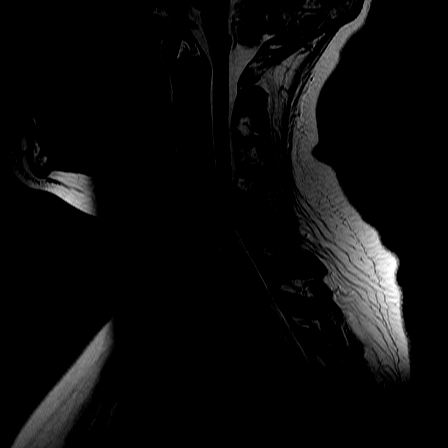
[im 9/13]
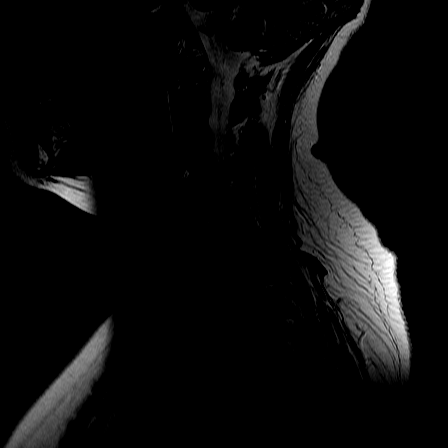
[im 11/13]
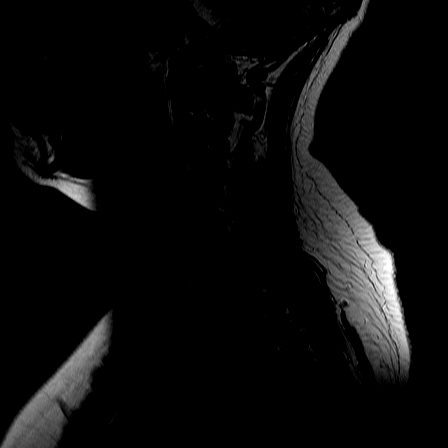
[im 13/13]
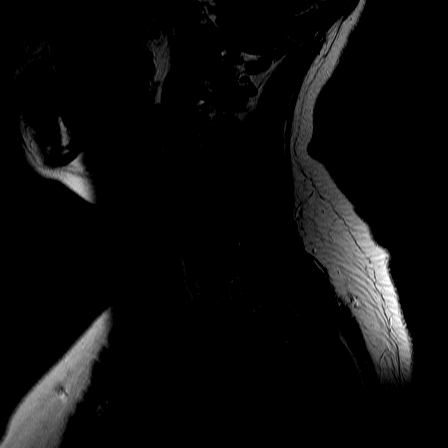

[Series 3: T1 · sagittal · 3.0mm · 0.75mm/px · 7 of 12 slices shown (1 of 2)]
[im 1/12]
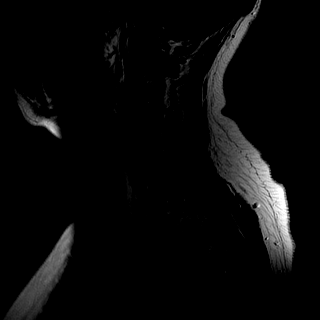
[im 2/12]
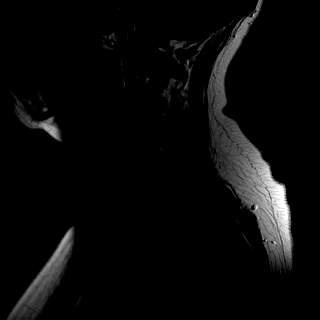
[im 4/12]
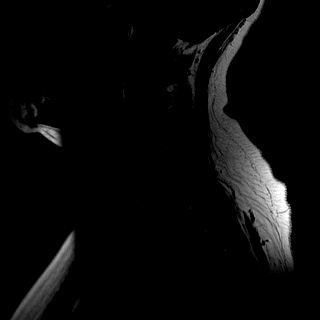
[im 6/12]
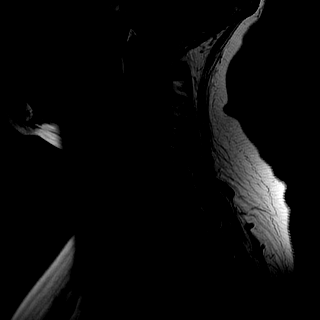
[im 8/12]
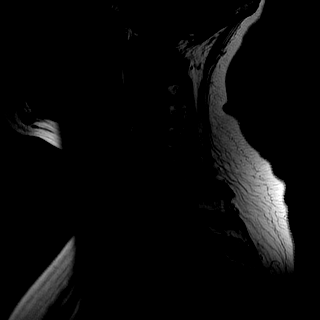
[im 10/12]
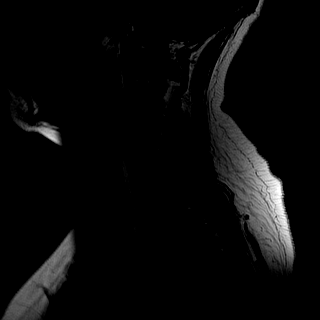
[im 12/12]
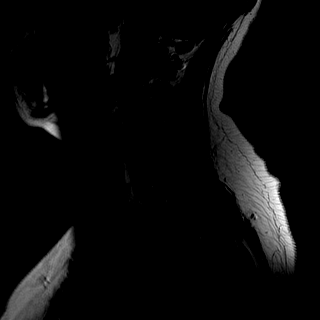

[Series 4: STIR · sagittal · 3.0mm · 0.94mm/px · 7 of 13 slices shown]
[im 1/13]
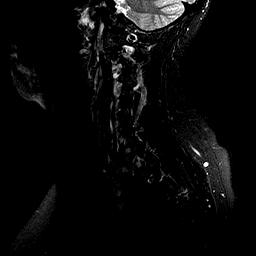
[im 3/13]
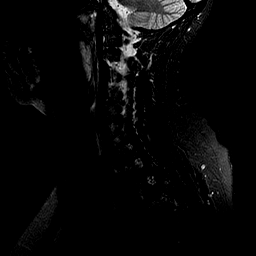
[im 5/13]
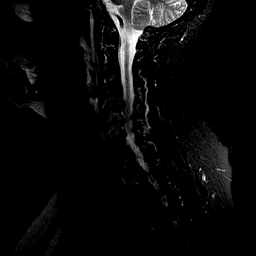
[im 7/13]
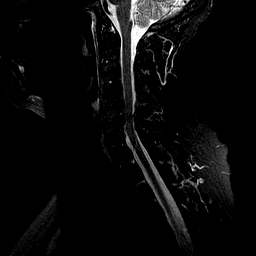
[im 9/13]
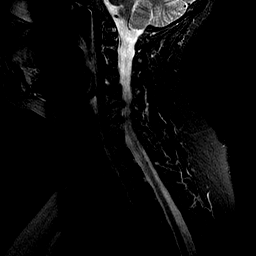
[im 11/13]
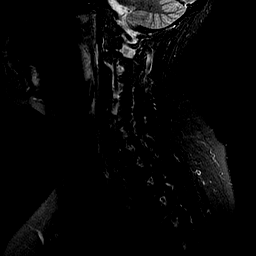
[im 13/13]
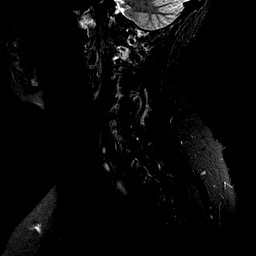

[Series 5: provider echo axial · axial · 3.0mm · 0.39mm/px · z∈[-29,+60]mm · 8 of 23 slices shown]
[im 1/23]
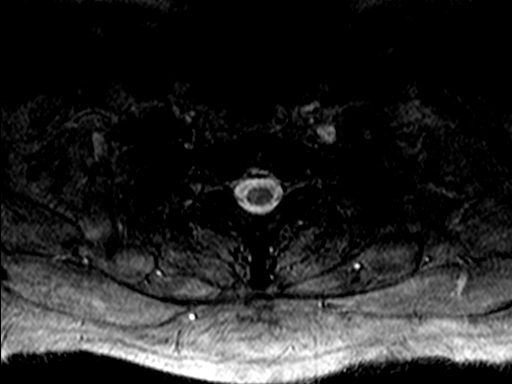
[im 4/23]
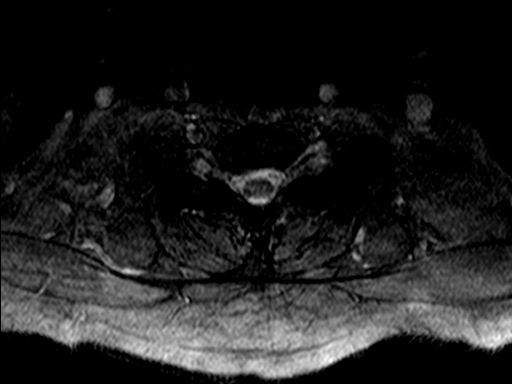
[im 8/23]
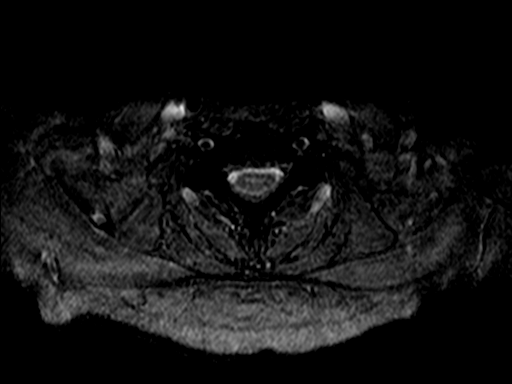
[im 10/23]
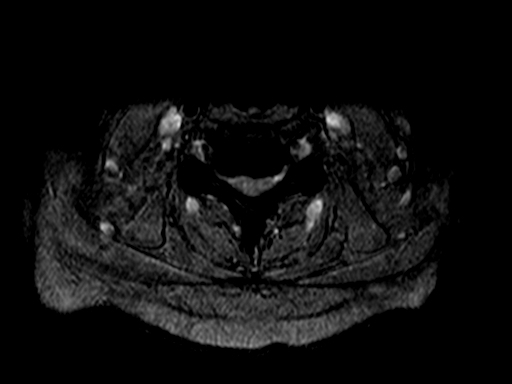
[im 13/23]
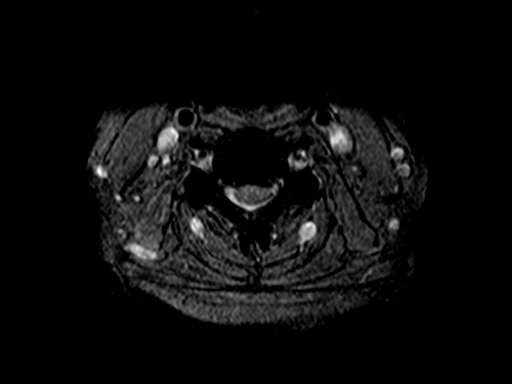
[im 15/23]
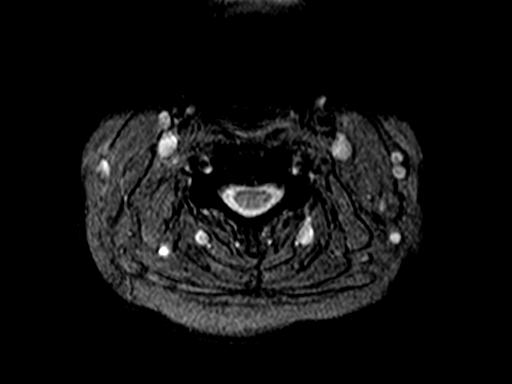
[im 19/23]
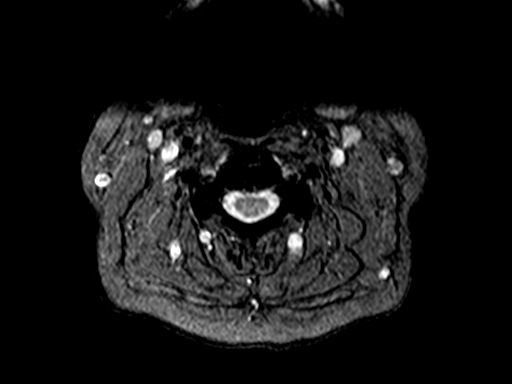
[im 23/23]
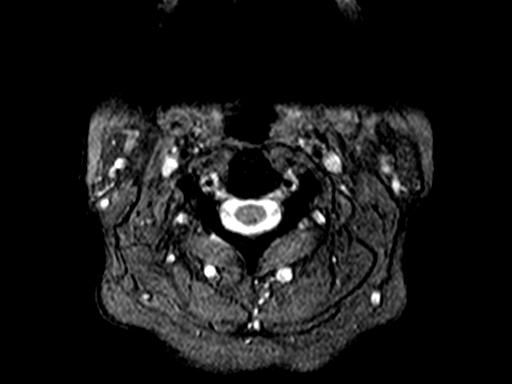

[Series 6: T1 · axial · 3.0mm · 0.78mm/px · z∈[-29,+60]mm · 13 of 24 slices shown (2 of 2)]
[im 1/24]
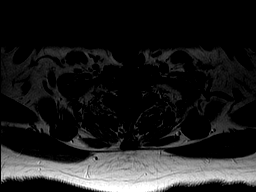
[im 2/24]
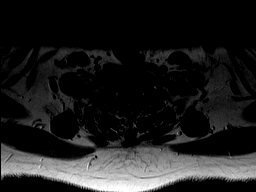
[im 4/24]
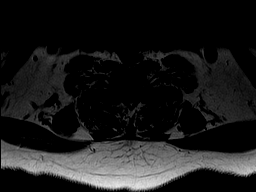
[im 6/24]
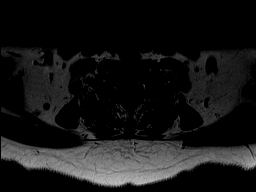
[im 8/24]
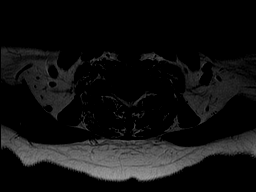
[im 9/24]
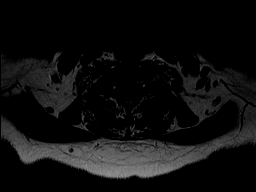
[im 11/24]
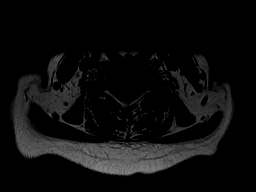
[im 13/24]
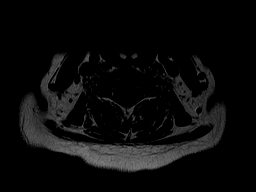
[im 15/24]
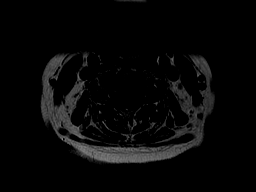
[im 16/24]
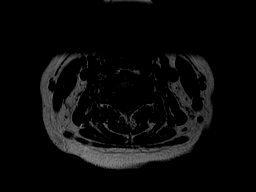
[im 18/24]
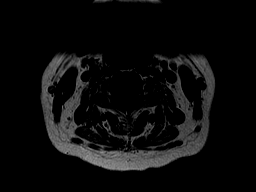
[im 20/24]
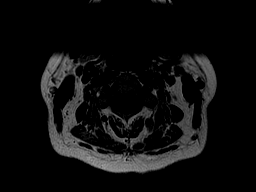
[im 24/24]
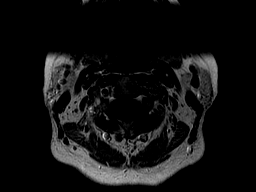

[42 of 48 positions shown; findings below may reference images not displayed]

DIAGNOSTIC STUDIES

EXAM

MRI of the cervical spine without contrast.

INDICATION

neck pain, cervical radiculopathy
PAIN TO NECK, SHOULDER AND DOWN RT ARM WITH TINGLING TO RT HAND.  RG

TECHNIQUE

Sagittal and axial images were obtained with variable T1 and T2 weighting.

COMPARISONS

None available

FINDINGS

There is straightening of the cervical lordosis probably due to position or spasm. No abnormal
signal properties of the visualized brainstem, cervical cord, or visualized thoracic cord are seen.
Minor degenerative changes are noted at C1-2. The C2-3, and C3-4 discs are normal.

C4-5: There is loss of height and disc osteophyte complex at this level resulting in mild central
canal and mild to moderate bilateral bony neural foraminal stenosis.

C5-6: Disc osteophyte complex is noted at this level resulting in moderate central canal stenosis
and moderate neural foraminal stenosis bilaterally greater on the right than left.

C6-7: There is mild disc osteophyte complex with mild to moderate right neural foraminal stenosis.

C7-T1: The disc is normal at this level without central canal or neural foraminal stenosis.

IMPRESSION

Prominent disc osteophyte complex at C5-6 resulting in moderate central canal stenosis and moderate
bilateral neural foraminal stenosis greater on the right than left.

Mild to moderate bilateral bony neural foraminal stenosis at C4-5 and mild central canal narrowing
at C4-5.

Mild to moderate right bony neural foraminal stenosis at C6-7.

Tech Notes:

PAIN TO NECK, SHOULDER AND DOWN RT ARM WITH TINGLING TO RT HAND.  RG

## 2020-10-10 IMAGING — MG MAMMOGRAM 3D DX, RIGHT
3 series · 8 of 8 positions shown · non-contrast
Comparison: none

[Series 4: R tomo · axial · right · 1.0mm · 0.09mm/px · z∈[-23,+47]mm · 3 of 71 slices shown (1 of 2)]
[im 1/71]
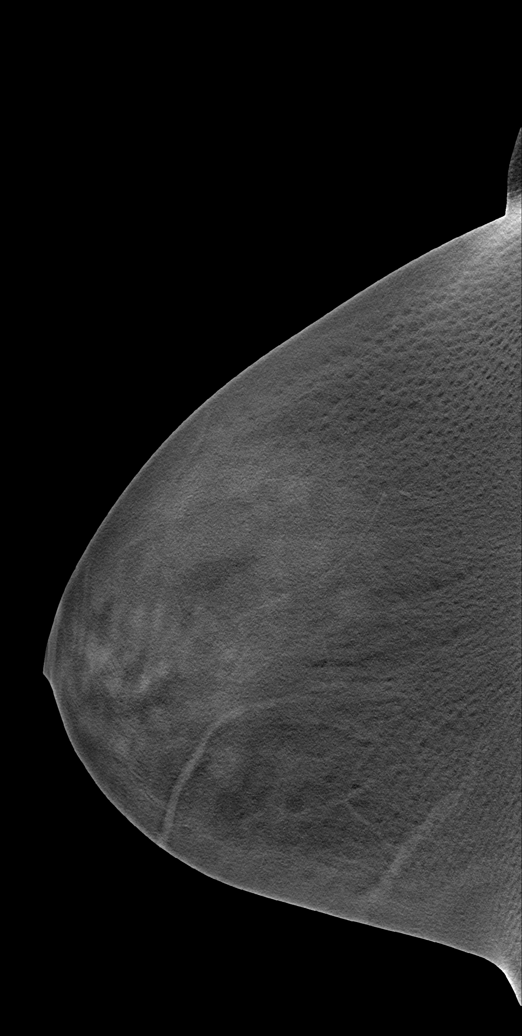
[im 36/71]
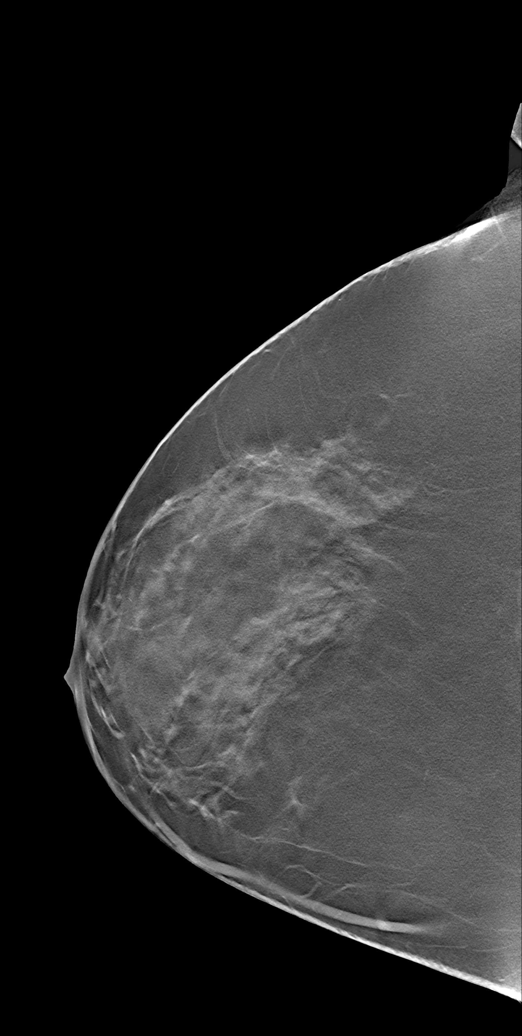
[im 71/71]
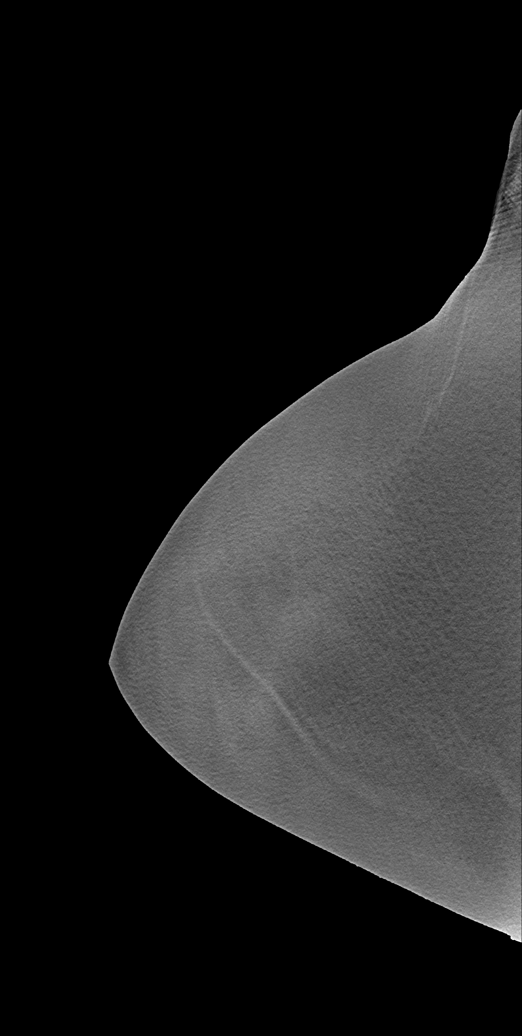

[R MLO]
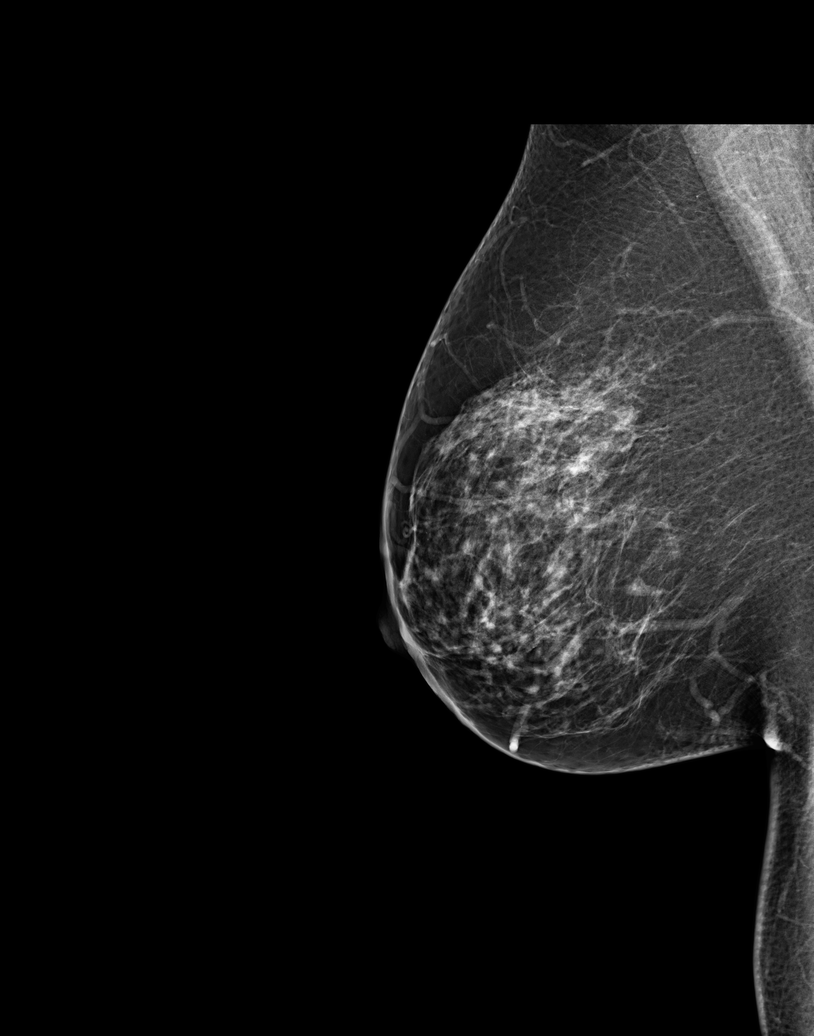

[Series 7: R tomo · oblique · right · 1.0mm · 0.09mm/px · 4 of 71 slices shown (2 of 2)]
[im 1/71]
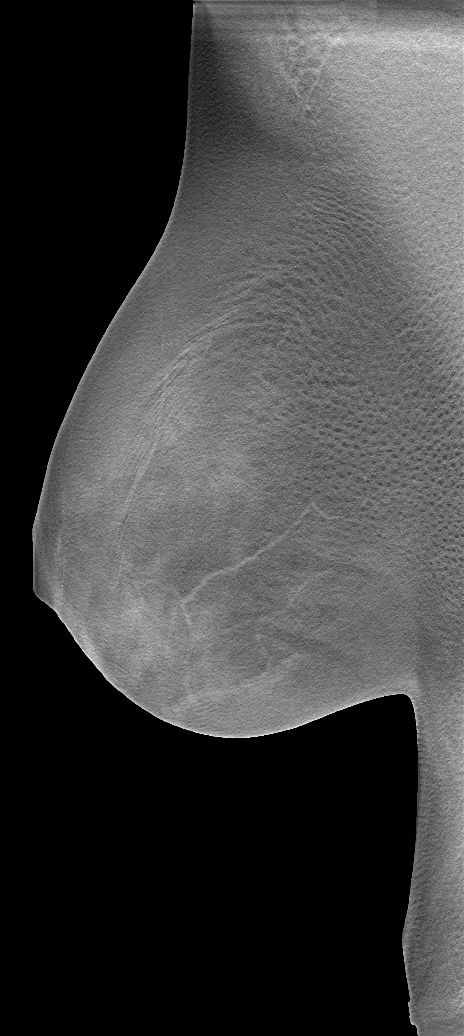
[im 24/71]
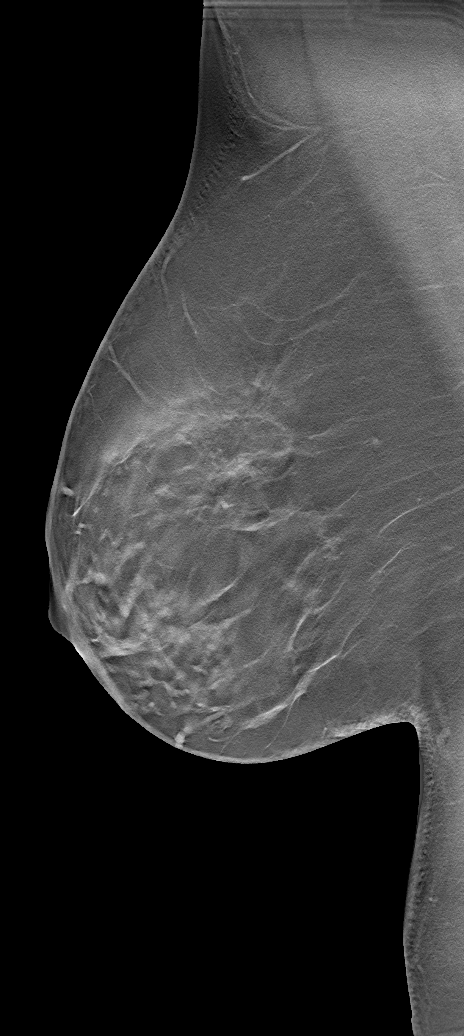
[im 47/71]
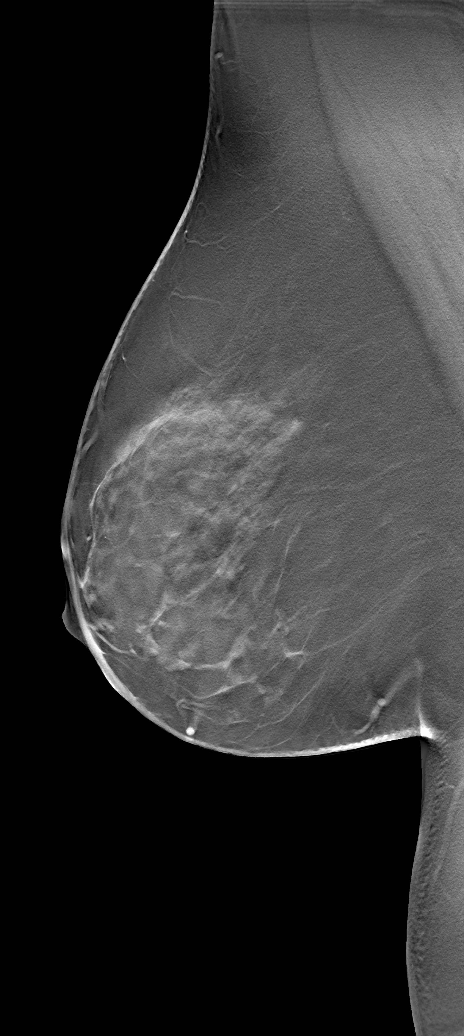
[im 71/71]
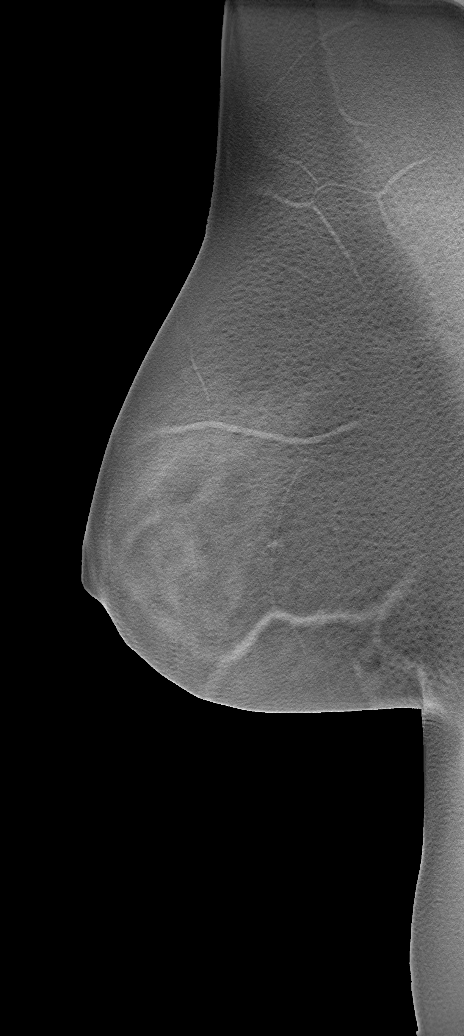

[8 of 8 positions shown; findings below may reference images not displayed]

EXAM

MAMMOGRAM

INDICATION

ATH previous abnormal mammogram
6 mo f/u rt dx mammo. KF

TECHNIQUE

2D and tomosynthesis digital craniocaudal and mediolateral oblique views were obtained of the right
breast breasts. Computer aided detection software was utilized.

COMPARISONS

04/10/2020 back through 02/16/2019.

FINDINGS

There is scattered fibroglandular tissue.

[There is no suspicious microcalcification, architectural distortion, or spiculated mass.]

Previously seen focal asymmetry is no longer visualized.

IMPRESSION

BI-RADS 1, NEGATIVE

Return to annual screening mammography. The patient only due for bilateral mammography in 6
months.

A reminder letter will be sent.

Tech Notes:

## 2020-11-08 IMAGING — CR [ID]
2 series · 2 of 2 positions shown · non-contrast
Comparison: none

[chest pa]
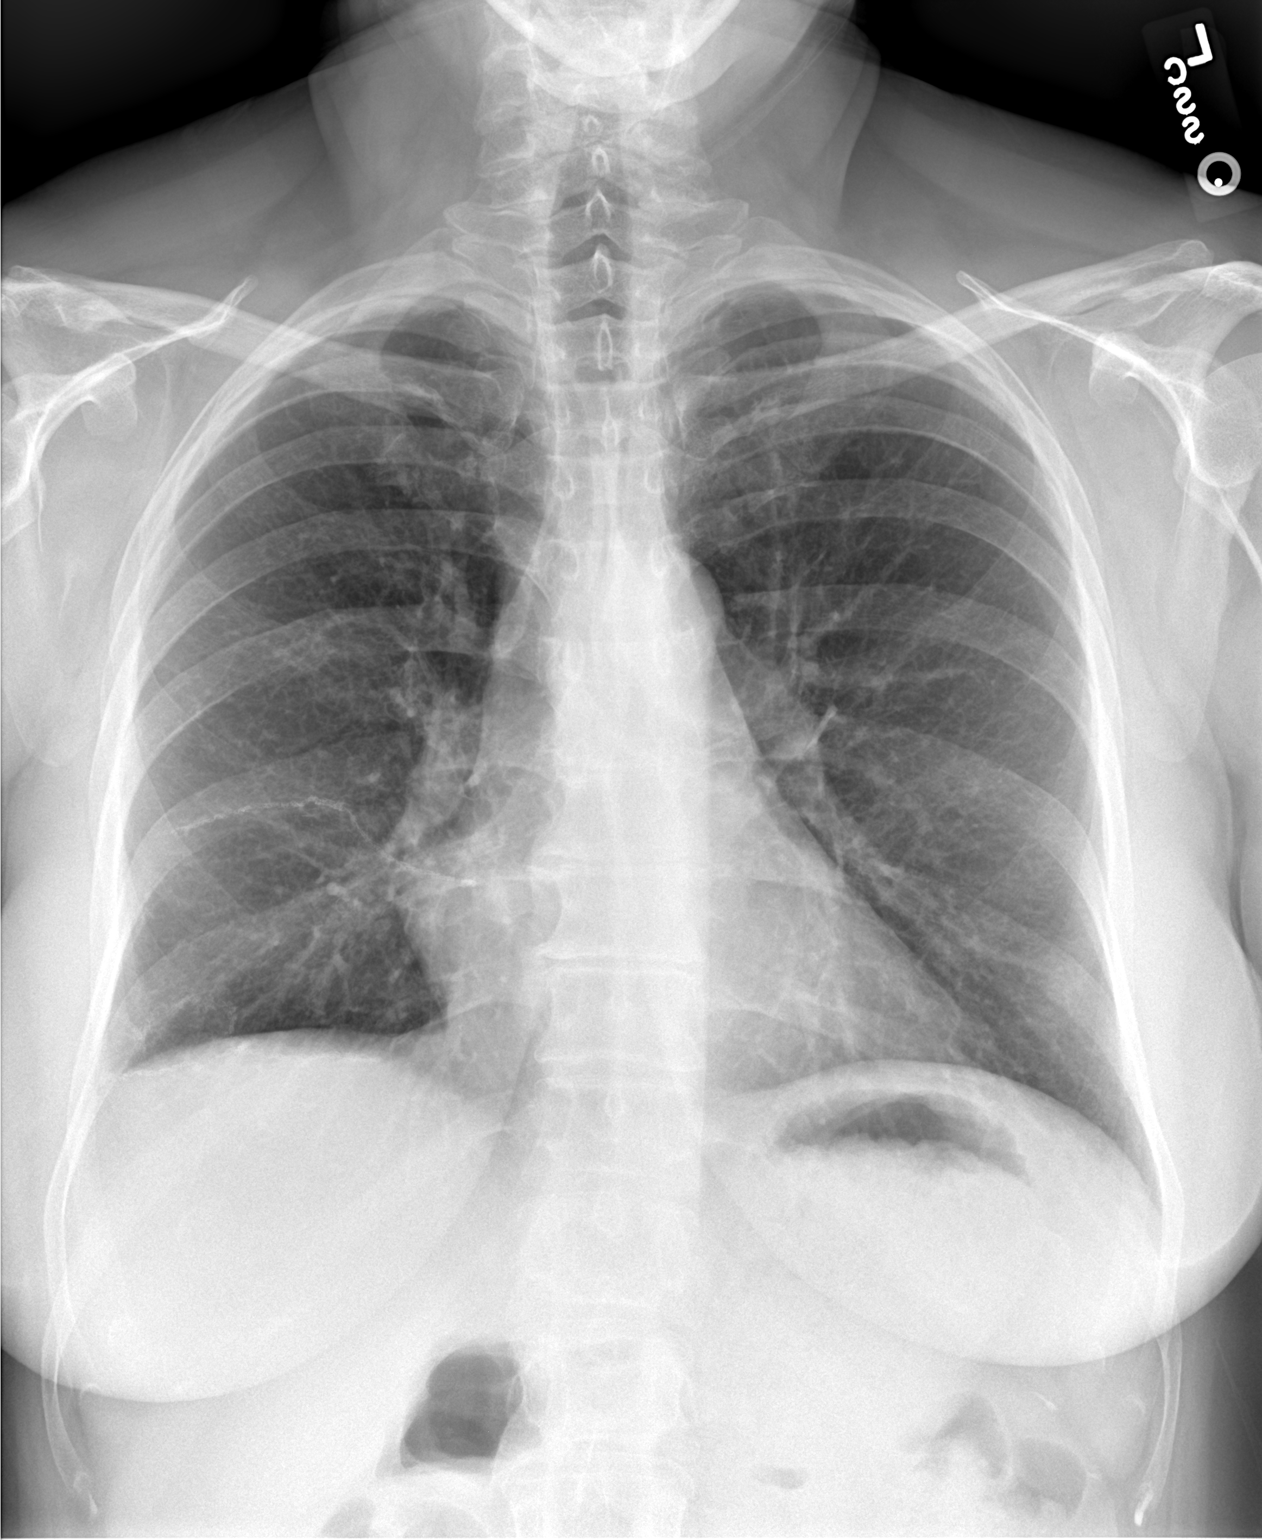

[chest lat]
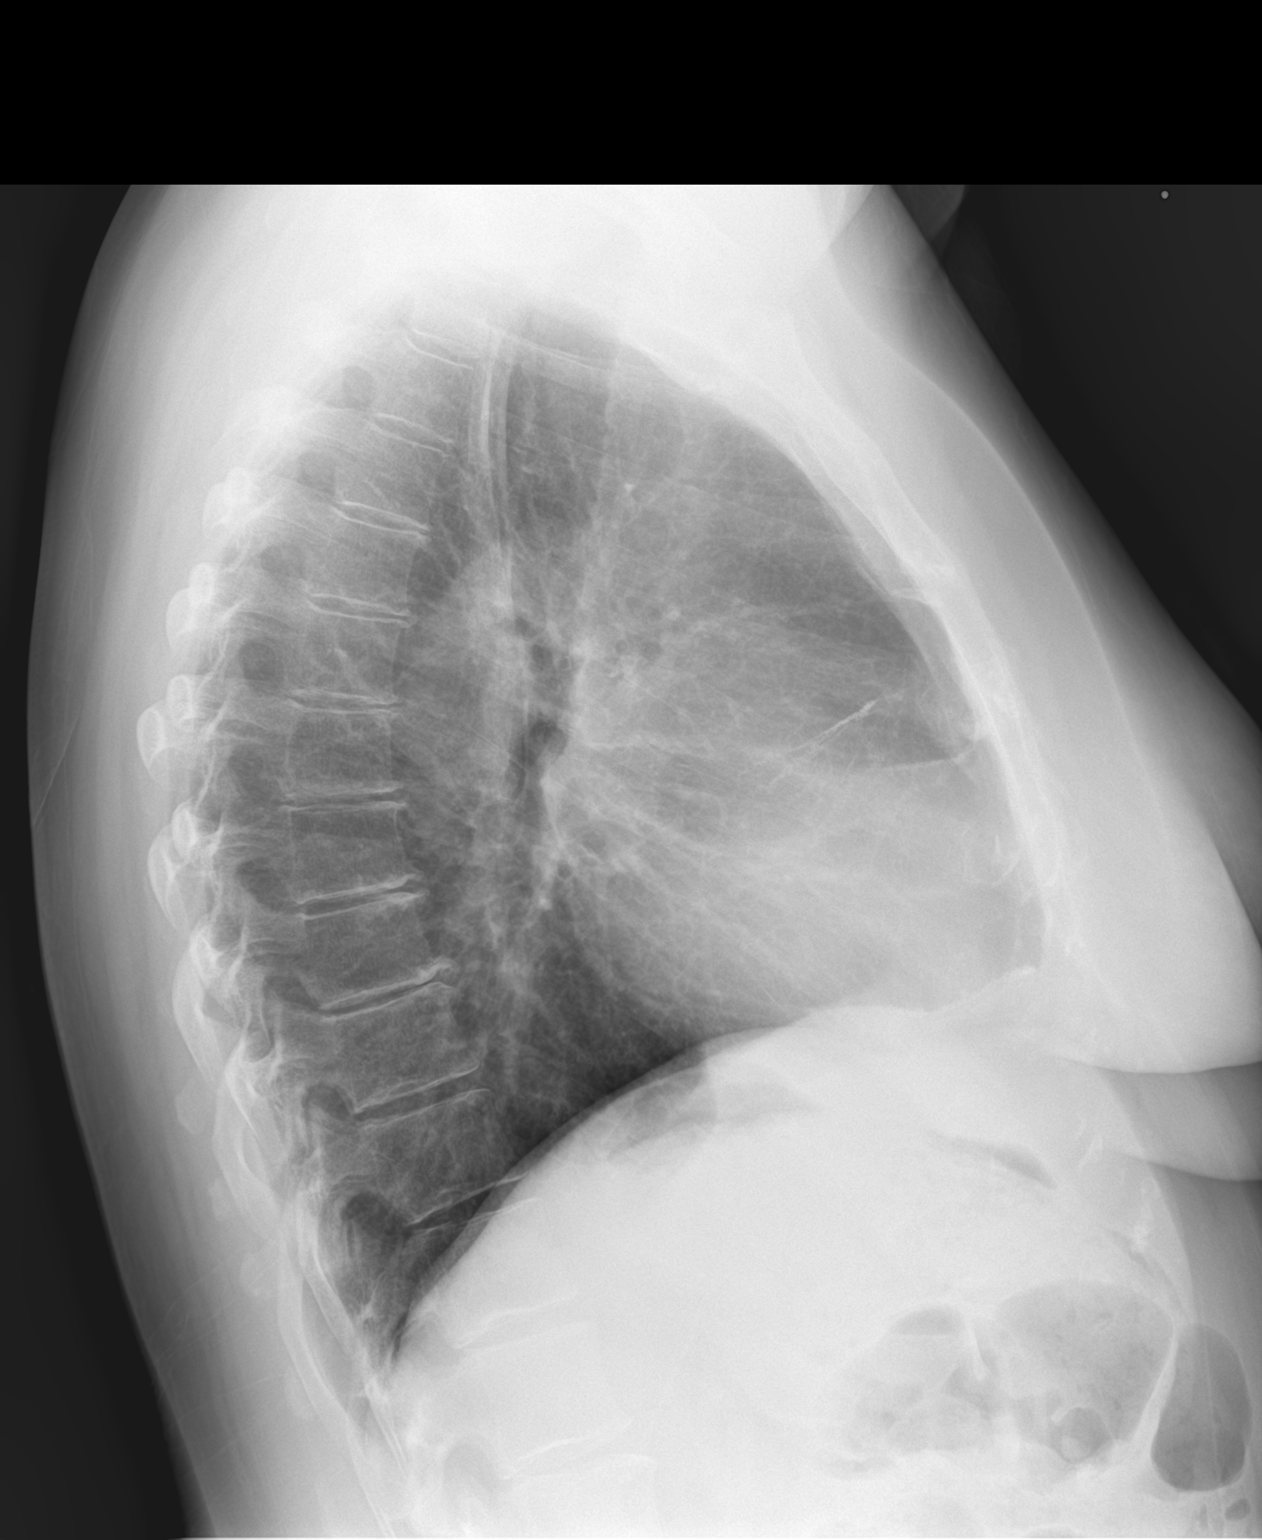

[2 of 2 positions shown; findings below may reference images not displayed]

DIAGNOSTIC STUDIES

EXAM

XR chest 2V

INDICATION

cough, recent covid infection
PT C/O COUGH, RECENTLY EXPOSED TO COVID. TJ/CS

TECHNIQUE

AP AND LATERAL

COMPARISONS

04/24/2020

FINDINGS

The heart is normal. No focal consolidation, pleural effusion or pneumothorax. No aggressive
osseous lesions.

IMPRESSION

No acute pulmonary findings.

Tech Notes:

PT C/O COUGH, RECENTLY EXPOSED TO COVID. TJ/CS

## 2020-11-13 ENCOUNTER — Encounter: Admit: 2020-11-13 | Discharge: 2020-11-13 | Payer: BC Managed Care – PPO

## 2020-11-16 IMAGING — CT CHEST WO(Adult)
2 of 6 series · 15 of 36 positions shown, 18 images · non-contrast
Comparison: none

[Series 4: thorax cor 1.50 br40 s3 · coronal · 0.69mm/px · 3 of 206 slices shown]
[im 42/206  lung]
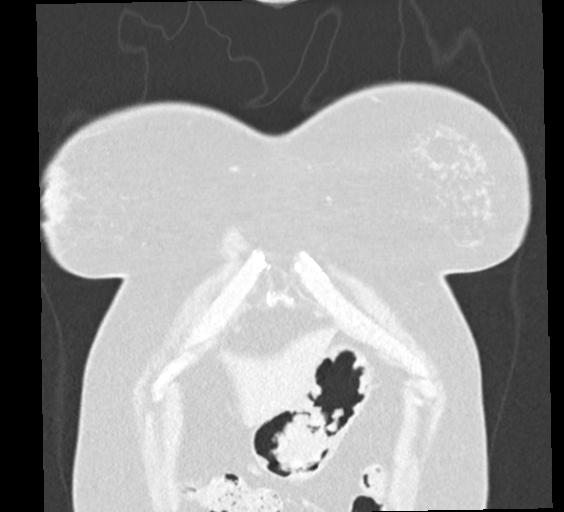
[im 83/206  lung]
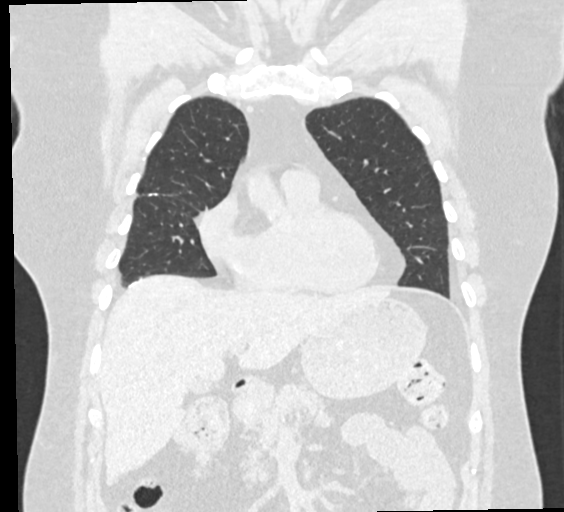
[im 124/206  lung]
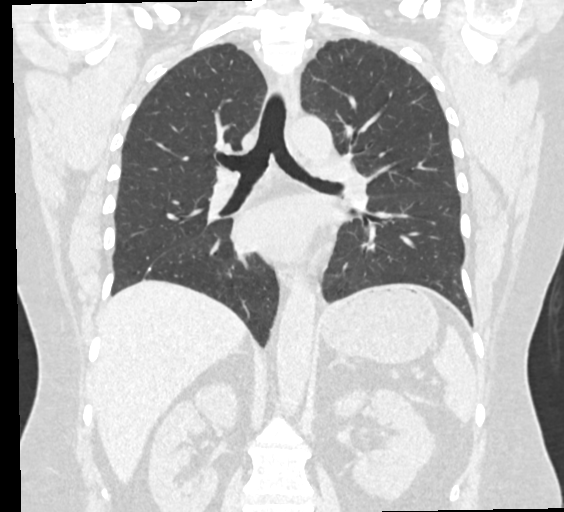

[Series 11: thorax 1.00 br60 s3 · axial · 0.74mm/px · z∈[+1677,+1974]mm · 12 of 497 slices shown, 15 images]
[im 36/497  mediastinal]
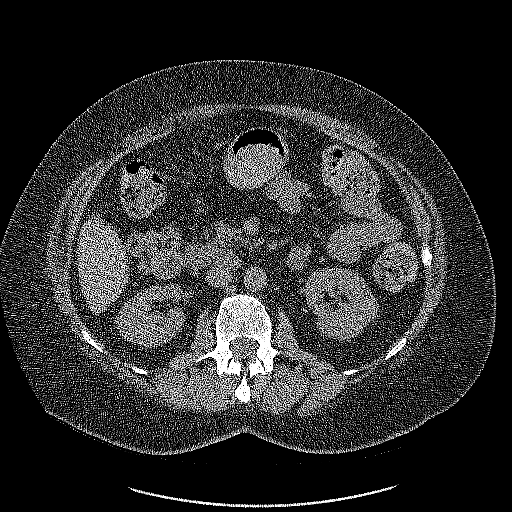
[im 36/497  lung]
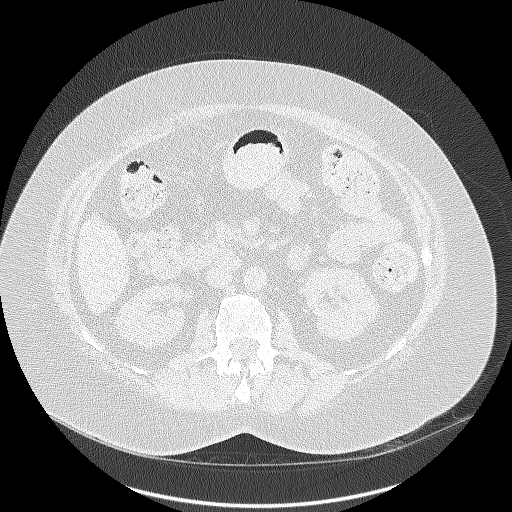
[im 71/497  lung]
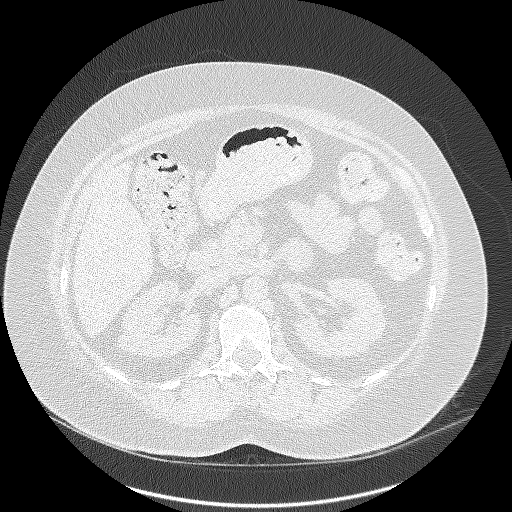
[im 107/497  lung]
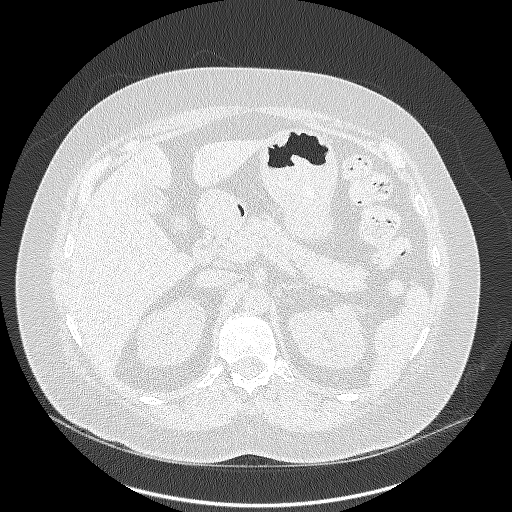
[im 142/497  lung]
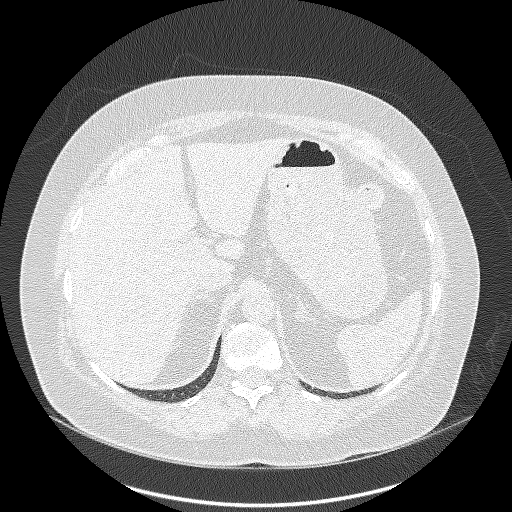
[im 178/497  mediastinal]
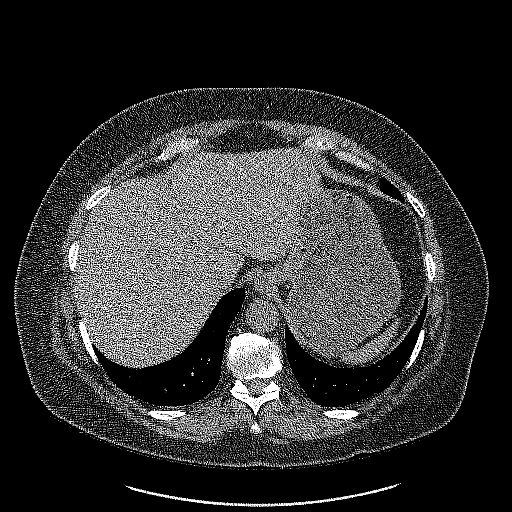
[im 178/497  lung]
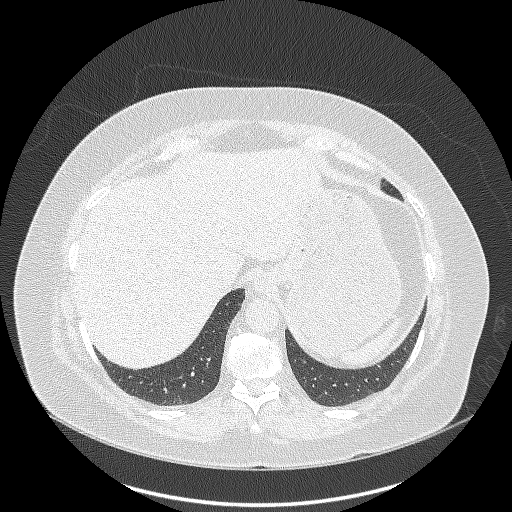
[im 213/497  lung]
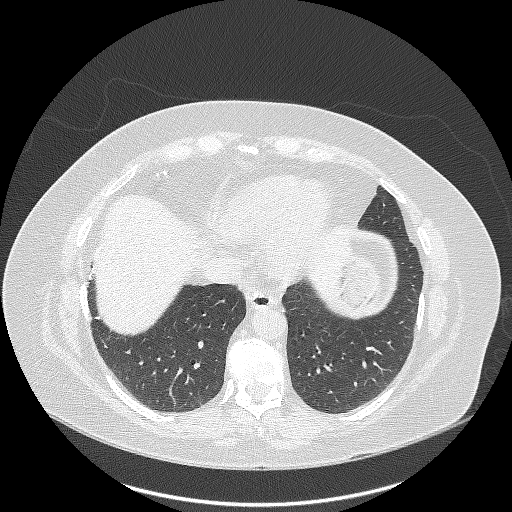
[im 284/497  lung]
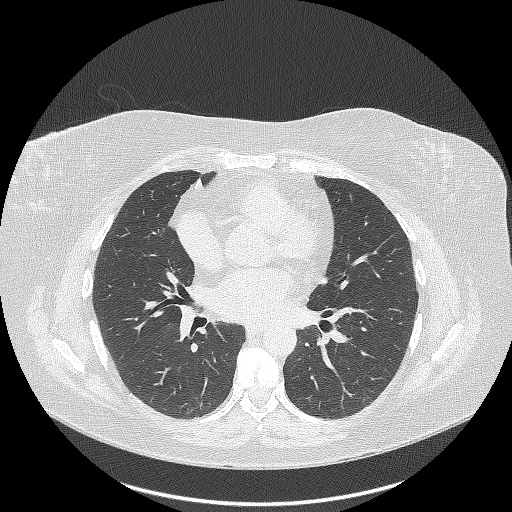
[im 319/497  lung]
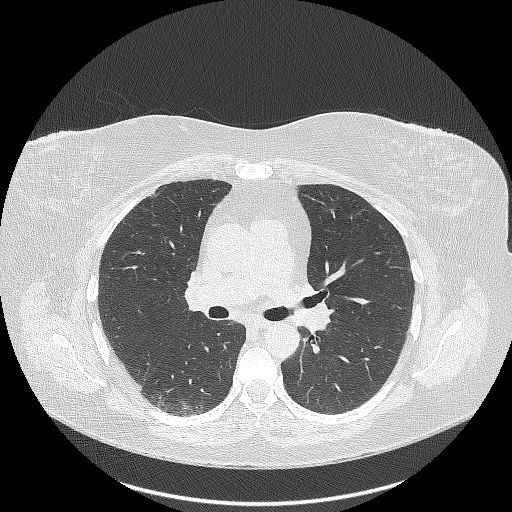
[im 355/497  mediastinal]
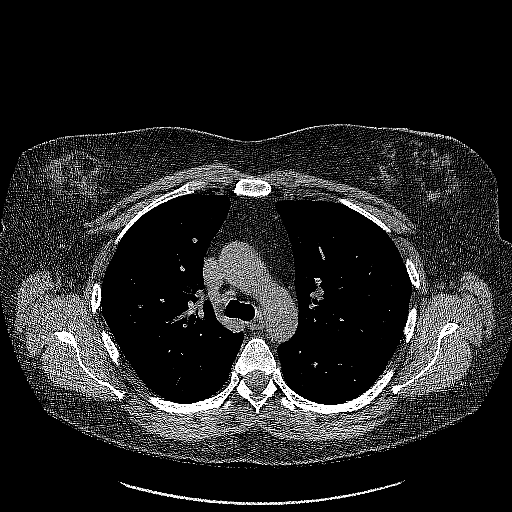
[im 355/497  lung]
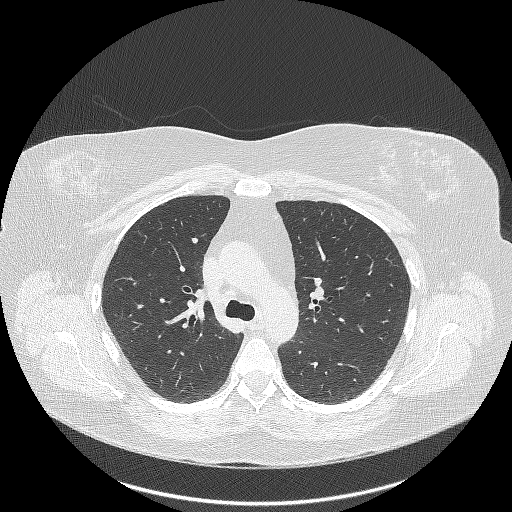
[im 390/497  lung]
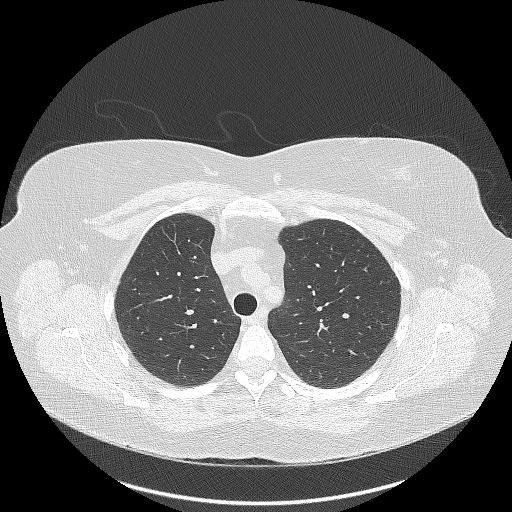
[im 426/497  lung]
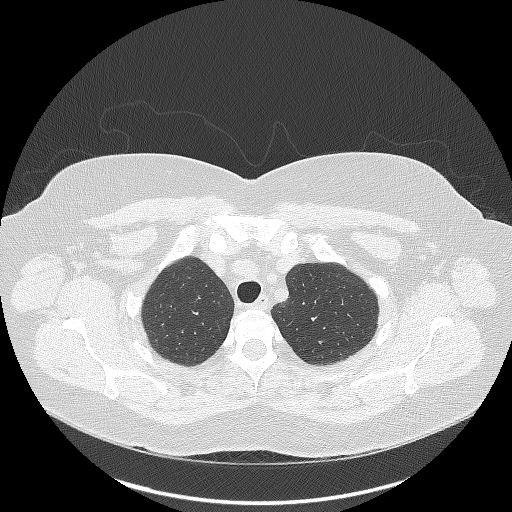
[im 461/497  lung]
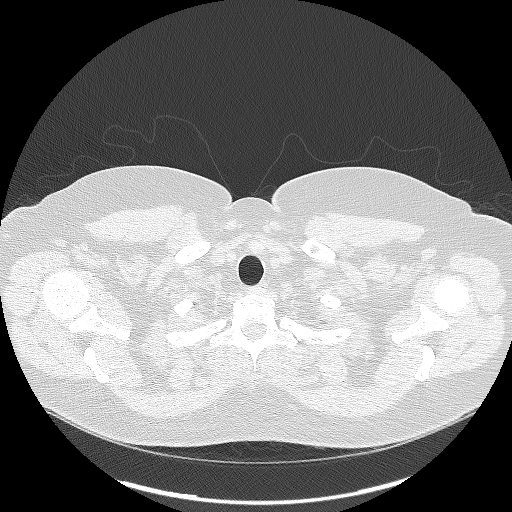

[15 of 36 positions shown; findings below may reference images not displayed]

DIAGNOSTIC STUDIES

EXAM

CT chest without contrast

INDICATION

persistent cough
PT STATES HAS PERSISTENT COUGH, SOA X FEW MONTHS. HX HYPERSENSITIVITY PNEUMONITIS, COVD. CT/NM 0/0.
TJ

TECHNIQUE

All CT scans at this facility use dose modulation, iterative reconstruction, and/or weight based
dosing when appropriate to reduce radiation dose to as low as reasonably achievable.

Number of previous computed tomography exams in the last 12 months is 0  .

Number of previous nuclear medicine myocardial perfusion studies in the last 12 months is 0  .

COMPARISONS

None available

FINDINGS

No thoracic adenopathy is seen. Heart size is normal. No pericardial effusion.

The visualized subdiaphragmatic structures are within normal limits.

Linear scarring and/or postop changes are noted the right middle lobe. No pulmonary nodules are
seen. Minimal posterior peripheral ground-glass opacities can be seen within the superior segment of
the right lower lobe image 81 series 8.

No concerning osseous lesions are seen.

IMPRESSION

Minimal ground-glass opacities in the superior segment right lower lobe which may indicate mild
pneumonitis. This also may be chronic. If symptoms progress, follow-up imaging may be necessary.

No thoracic adenopathy. Chronic scarring is noted in the right lung base and right middle lobe.

Tech Notes:

PT STATES HAS PERSISTENT COUGH, SOA X FEW MONTHS. HX HYPERSENSITIVITY PNEUMONITIS, COVD. CT/NM 0/0.
TJ

## 2021-01-25 ENCOUNTER — Encounter: Admit: 2021-01-25 | Discharge: 2021-01-25 | Payer: BC Managed Care – PPO

## 2021-01-29 ENCOUNTER — Encounter: Admit: 2021-01-29 | Discharge: 2021-01-29 | Payer: BC Managed Care – PPO

## 2021-01-29 ENCOUNTER — Ambulatory Visit: Admit: 2021-01-29 | Discharge: 2021-01-29 | Payer: BC Managed Care – PPO

## 2021-01-29 DIAGNOSIS — Z91018 Allergy to other foods: Secondary | ICD-10-CM

## 2021-01-29 DIAGNOSIS — J3089 Other allergic rhinitis: Secondary | ICD-10-CM

## 2021-01-29 DIAGNOSIS — R053 Chronic cough: Secondary | ICD-10-CM

## 2021-01-29 MED ORDER — AZELASTINE 137 MCG (0.1 %) NA SPRA
2 | Freq: Two times a day (BID) | NASAL | 1 refills | 50.00000 days | Status: AC
Start: 2021-01-29 — End: ?

## 2021-02-01 ENCOUNTER — Encounter: Admit: 2021-02-01 | Discharge: 2021-02-01 | Payer: BC Managed Care – PPO

## 2021-02-06 ENCOUNTER — Encounter: Admit: 2021-02-06 | Discharge: 2021-02-06 | Payer: BC Managed Care – PPO

## 2021-02-06 MED ORDER — EPINEPHRINE 0.3 MG/0.3 ML IJ ATIN
INTRAMUSCULAR | 0 refills | 30.00000 days | Status: AC
Start: 2021-02-06 — End: ?

## 2021-03-13 ENCOUNTER — Encounter: Admit: 2021-03-13 | Discharge: 2021-03-13 | Payer: BC Managed Care – PPO

## 2021-03-13 ENCOUNTER — Ambulatory Visit: Admit: 2021-03-13 | Discharge: 2021-03-13 | Payer: BC Managed Care – PPO

## 2021-03-13 DIAGNOSIS — J3089 Other allergic rhinitis: Secondary | ICD-10-CM

## 2021-03-13 DIAGNOSIS — R053 Chronic cough: Secondary | ICD-10-CM

## 2021-03-13 DIAGNOSIS — R0989 Other specified symptoms and signs involving the circulatory and respiratory systems: Secondary | ICD-10-CM

## 2021-03-13 DIAGNOSIS — E039 Hypothyroidism, unspecified: Secondary | ICD-10-CM

## 2021-03-13 DIAGNOSIS — J301 Allergic rhinitis due to pollen: Secondary | ICD-10-CM

## 2021-03-13 DIAGNOSIS — J679 Hypersensitivity pneumonitis due to unspecified organic dust: Secondary | ICD-10-CM

## 2021-03-13 MED ORDER — OMEPRAZOLE 40 MG PO CPDR
40 mg | ORAL_CAPSULE | Freq: Every day | ORAL | 3 refills | Status: AC
Start: 2021-03-13 — End: ?

## 2021-03-13 MED ORDER — FAMOTIDINE 40 MG PO TAB
40 mg | ORAL_TABLET | Freq: Every day | ORAL | 3 refills | 90.00000 days | Status: AC
Start: 2021-03-13 — End: ?

## 2021-03-13 NOTE — Progress Notes
Date of Service: 03/13/2021    Subjective:             Danielle Garner is a 50 y.o. female.    History of Present Illness  The patient is a 50 year old female who presents today for evaluation of nasal drainage. She is referred by Danielle Garner in our internal medicine allergy clinic. I reviewed Danielle Garner notes from 01/29/2021, which suggest that Danielle Garner has a history of consistent nasal congestion, rhinorrhea, and cough that began in 2008. She had a pulmonary work-up at that time that included breathing test, imaging, and lung biopsies. She was diagnosed with hypersensitivity pneumonitis at that time. One of her triggers was thought to be mold as she moved into a new house and her symptoms did improve at that time. Initially, she did well and only had typically 1 episode a year of congestion and cough management with over-the-counter medications. Over the last several years, this has become an increasingly more frequent occurrence and the severity of the symptoms has been increasing as well. In this past year, she has had 2 episodes of cough that required oral steroids. She was prescribed an Advair inhaler which she used apparently for 2 months that helped and is currently now using only Singulair. This also has helped to maintain control of her cough symptoms. Previous allergy testing prior to meeting Danielle Garner suggested numerous positive arrow allergens. She had done immunotherapy in the past, but only was able to tolerate it for a few weeks due to severe fatigue, muscle aches, and joint pains. Some relief, she has been able to tolerate her standard nasal steroid sprays due to symptoms of nausea associated with 4 different varieties. Repeat arrow allergen testing by Danielle Garner showed positivity to trees, grasses, weeds, dust mite, cockroach, feather, and mold. They discussed adding Astelin to her regimen and continuing her Singulair and avoiding some of these arrow allergens when possible. She had done the same visit and was not consistent with asthma and was read essentially normal.    She states that she can only tolerate the Astelin at night. She states that in the last 2 to 4 nights when she used it, she had a runny nose. She states that in the morning when she has to get up and get around, her stomach is a little bit more of a concern. She was told that the Flonase Sensimist might be a backup option for her as it is a finer spray. She does not feel like it is a big shot and she can do it in the mornings and she can tolerate it a lot better. Her stomach tolerates it a lot better and it does not run back down, but she does not notice a difference. Her main concern is the postnasal drip 24/7. She states that it does not go away anymore. She gets globs in her throat when she tries to swallow it. When she clears her throat, sometimes the tickle causes her to cough. She states that once she starts the cough, if she does not get it under control, it lasts and last. She states that her lungs are irritated, her throat hurts, and her back hurts. She states that nothing has gotten quite as bad as it was back in 2008 and 2009 where it was an entire year and she was administering a lot of steroids. It is getting to be much more frequent and taking a lot more work to get over it. She states  that she hibernated in her house during 10/2020 and 11/2020 trying to keep herself away from everything. The patient states that it took her until the middle of 12/2020 to get control of her cough. She has a component of both types of drainage today. She does not eat within 1 to 2 hours of going to bed at night. She denies eating fried foods, fast foods, or greasy foods as she is allergic to most of it. The patient does not eat chocolate. She drinks coffee and tea. She states that she drinks 2 cups in the morning and tea in the afternoon. She states that she drinks alcohol socially. The patient does not have a lot of citrus base food right now, but she have tomato based food like [place] food. She does have a history of reflux and heartburn. She administers omeprazole 20mg  in the morning. She does not feel like her throat clearing is worse during a particular time of the day. She states that the shower has a tendency to loosen things up a little bit. She states that her ears have a tendency to have pressure.         Review of Systems   Constitutional: Negative.    HENT: Positive for postnasal drip and sinus pressure.    Eyes: Negative.    Respiratory: Negative.    Cardiovascular: Negative.    Gastrointestinal: Negative.    Endocrine: Negative.    Genitourinary: Negative.    Musculoskeletal: Negative.    Skin: Negative.    Allergic/Immunologic: Negative.    Neurological: Negative.    Hematological: Negative.    Psychiatric/Behavioral: Negative.          Objective:         ? ADVAIR HFA 230-21 mcg/actuation inhaler    ? azelastine (ASTELIN) 137 mcg (0.1 %) nasal spray Apply two sprays to each nostril as directed twice daily.   ? buPROPion XL (WELLBUTRIN XL) 150 mg tablet Take 150 mg by mouth every morning.   ? cyclobenzaprine (FLEXERIL) 10 mg tablet Take 10 mg by mouth at bedtime daily.   ? EPINEPHrine (EPIPEN 2-PAK) 1 mg/mL injection pen (2-Pack) Inject 0.3 mg (1 Pen) into thigh if needed for anaphylactic reaction. May repeat in 5-15 minutes if needed.   ? levothyroxine (SYNTHROID) 75 mcg tablet Take 75 mcg by mouth daily 30 minutes before breakfast.   ? loratadine (CLARITIN) 10 mg tablet Take 10 mg by mouth daily.   ? montelukast (SINGULAIR) 10 mg tablet    ? progesterone, micronized (PROMETRIUM) 100 mg capsule    ? spironolactone (ALDACTONE) 100 mg tablet    ? spironolactone (ALDACTONE) 50 mg tablet      Vitals:    03/13/21 1321   BP: 109/69   Pulse: 74   Temp: 36.8 ?C (98.2 ?F)   PainSc: Zero   Weight: 88.5 kg (195 lb)   Height: 165.1 cm (5' 5)     Body mass index is 32.45 kg/m?Marland Kitchen     Physical Exam  General   Well-developed, well-nourished   Communication and Voice:  Clear pitch and clarity, age appropriate  Head and Face   Inspection:  Normocephalic and atraumatic without masses or lesions   Palpation:  Facial skeleton intact without bony stepoffs, no sinus tenderness   Salivary Glands:  No masses or tenderness   Facial Strength:  Facial motility symmetric and full bilaterally  Eyes   Nystagmus: None   EOM: Equal extraocular motion bilaterally  ENT   External nose:  No scar or anatomic deformity   Internal Nose:  Septum intact and midline.  No edema, polyps, or rhinorrhea   Lips, Teeth, and gums:  Mucosa and teeth intact and viable   Oral cavity/oropharynx:  No erythema or exudate, no nodules, lesions or masses noted  Ear   External canal:  Left - Canal is patent with intact skin                             Right - Canal is patent with intact skin   Tympanic Membranes:  Left - Clear and mobile                                            Right - Clear and mobile   Middle Ears:      Left - appears aerated, no effusion, no masses                           Right - appears aerated, no effusion, no masses  Neck:     Midline trachea without mass or lesion   Thyroid normal without palpable mass or nodularity   No lymphadenopathy  Respiratory   Respiratory effort:  Equal inspiration & expiration without use of accessory muscles. No  stridor  Neuro/Psych/Balance   Affect: Appropriate mood and affect   Cranial nerves: II-XII appear intact    Following topical decongestion and nasal anesthetic spray flexible laryngoscopy was performed.  The nasopharynx was normal.  The oropharynx was normal.   The base of tongue was normal.  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  The vocal cords and subglottis were normal with normal vocal cord motion.       Assessment and Plan:  Chronic cough with multiple contributors.  She has her nasal drainage and hypersensitivity pneumonitis under what appears to be reasonable control. 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   Flex exam was normal aside from posterior laryngeal inflammation consistent with reflux irritation.  We discussed dietary and lifestyle modifications and a handout was provided.     I would also like her to begin taking Prilosec 40mg  in the mornings and Pepcid 40mg  in the evenings. She will follow up in two months for repeat evaluation.    If there is no improvement consideration will be given to a pH probe test and possibly transnasal esophagoscopy.                         ATTESTATION  This visit was documented by Josefine Class via audio recorded by Dellie Burns, MD, on March 13, 2021, 4:16 PM.  Total Time Today was 45 minutes in the following activities: Preparing to see the patient, Performing a medically appropriate examination and/or evaluation, Counseling and educating the patient/family/caregiver, Ordering medications, tests, or procedures and Documenting clinical information in the electronic or other health record

## 2021-03-13 NOTE — Patient Instructions
Today we discussed having you start Prilosec (omeprazole) 40mg  to be taken as one pill in the morning and Pepcid (famotidine) 40mg  to be taken as one pill in the evenings.  Both medications should be taken ideally about 30 minutes prior to meal time.     Your prescription has been sent electronically to the Pharmacy you provided.  Your prescription should be available for pickup later today.     [facility] [place], [place] - [address]  Phone: [phone] Fax: [phone]      Laryngopharyngeal Reflux (LPR)    General Information  Laryngopharyngeal reflux (LPR) means that stomach acid is escaping from your stomach and irritating your throat and voice box.  The most common symptoms include hoarseness, frequent throat clearing, persistent cough, swallowing problems, or the feeling that something is stuck in your throat.  LPR is often called ?silent reflux? because it is not always associated with heartburn or pain.    Diagnosis  The diagnosis of LPR is mainly based on history and symptoms.  Evaluation of the throat and voice box often reveals inflammation or swelling.  This is typically noted in the posterior, or back, of the voice box.    Treatment  LPR can often be relieved by dietary and lifestyle modifications alone.  We recommend the following changes to give you the best chance of relieving your symptoms:     Drink lots of water (8-10) 8 Oz glasses a day   Reduce and eliminate caffeine (coffee, tea, soda, chocolate)   Reduce or eliminate carbonated drinks (the carbonic acid can be an irritant)   Avoid spicy, fatty or greasy foods   Avoid chocolates and mints   Minimize dairy products (they thicken your mucous)   Avoid alcohol and tobacco (especially right before bed)   Eat several smaller meals instead of a few large meals   Avoid eating 2-3 hours before bedtime  Raise the head of your bed 4-6 inches (2X4 brick under the bedposts or use a wedge pillow)    Sometimes dietary changes are not enough or the damage is significant enough that medications must be used in conjunction with lifestyle modification.  Typically these medications work to reduce the acid levels in your stomach in the hopes that this will reduce the acidity of the reflux.    The most common medications used are proton pump inhibitors (PPIs).  Nexium, Protonix, Prevacid, and Prilosec are common examples of PPIs.  Histamine blockers such as ranitidine (Zantac) are also commonly used - often in conjunction with PPIs.  LPR often requires 4-6 months of therapy to allow your throat and voice box to heal.  Once everything has healed and you have made some lifestyle changes as above, you can often reduce or eliminate the need for these medications.

## 2021-04-05 ENCOUNTER — Encounter: Admit: 2021-04-05 | Discharge: 2021-04-05 | Payer: BC Managed Care – PPO

## 2021-04-16 IMAGING — MG MAMMOGRAM 3D SCREEN, BILATERAL
8 series · 8 of 8 positions shown · non-contrast
Comparison: none

[R CC]
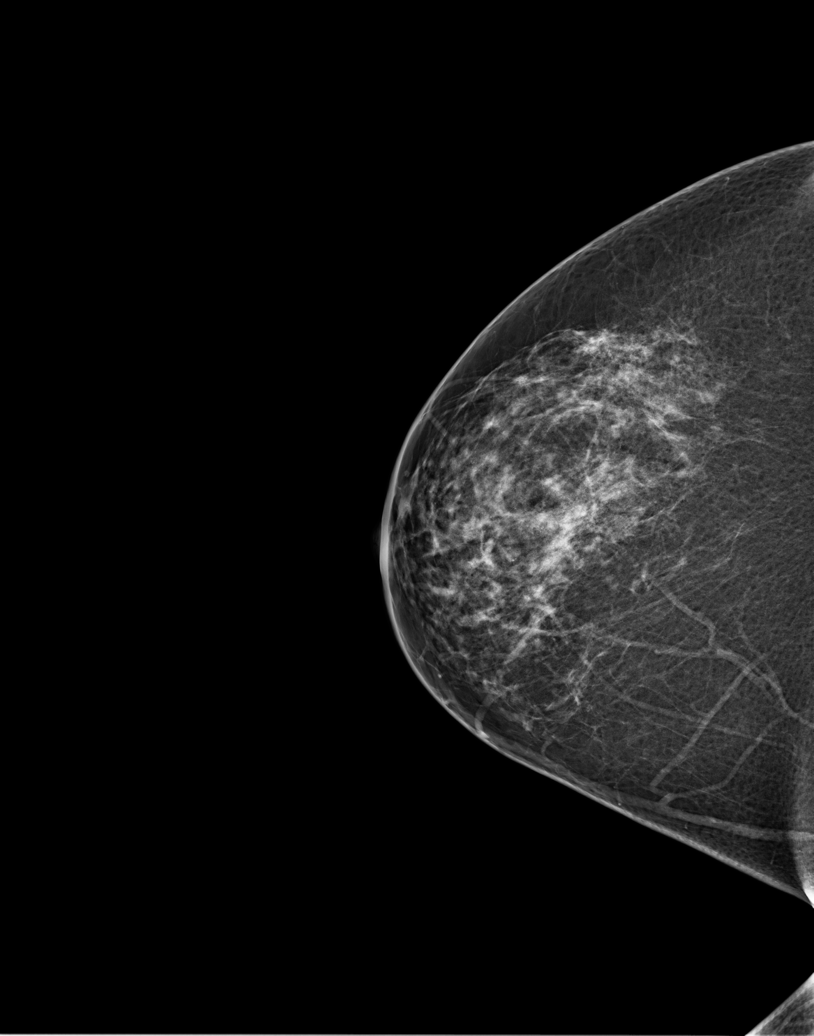

[R tomo (1 of 2)]
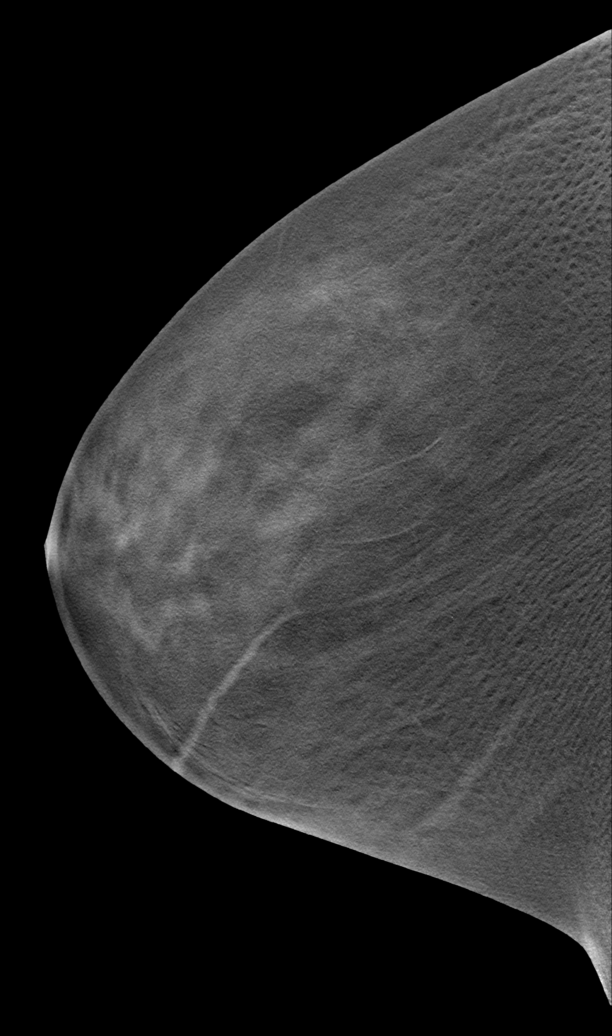

[L CC]
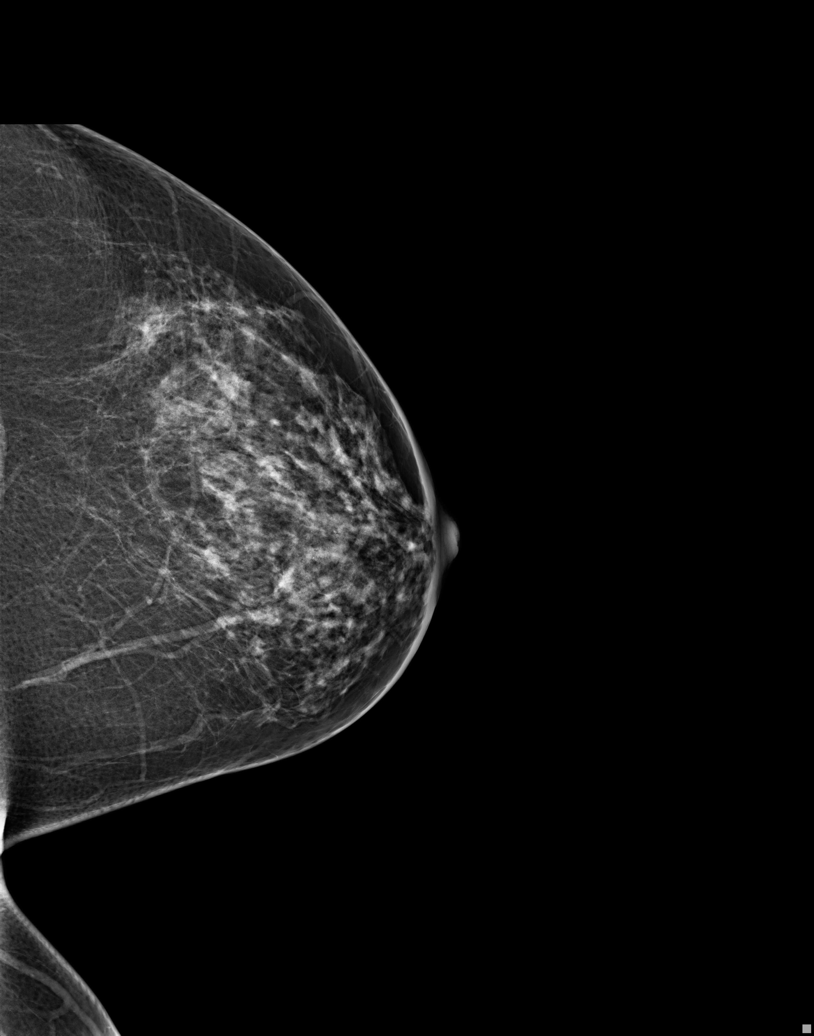

[L tomo (1 of 2)]
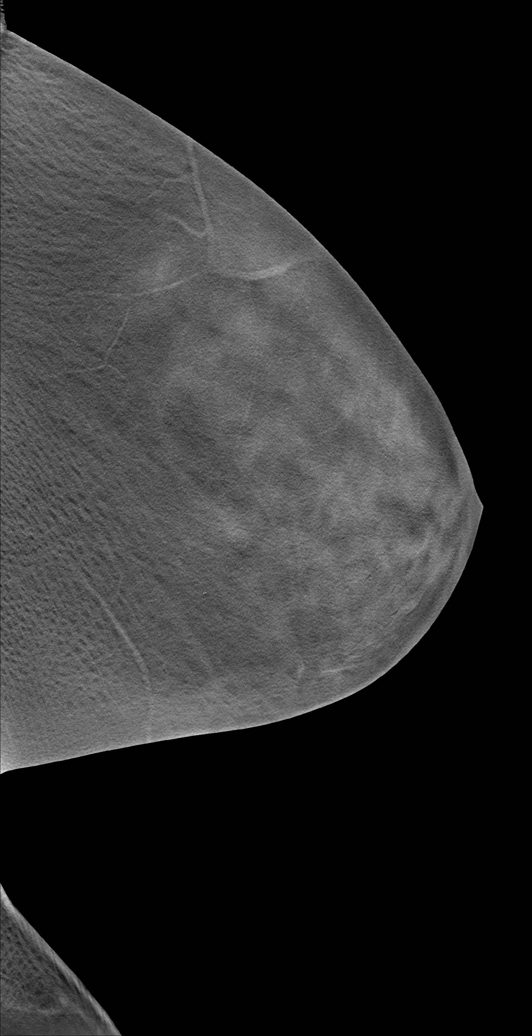

[R MLO]
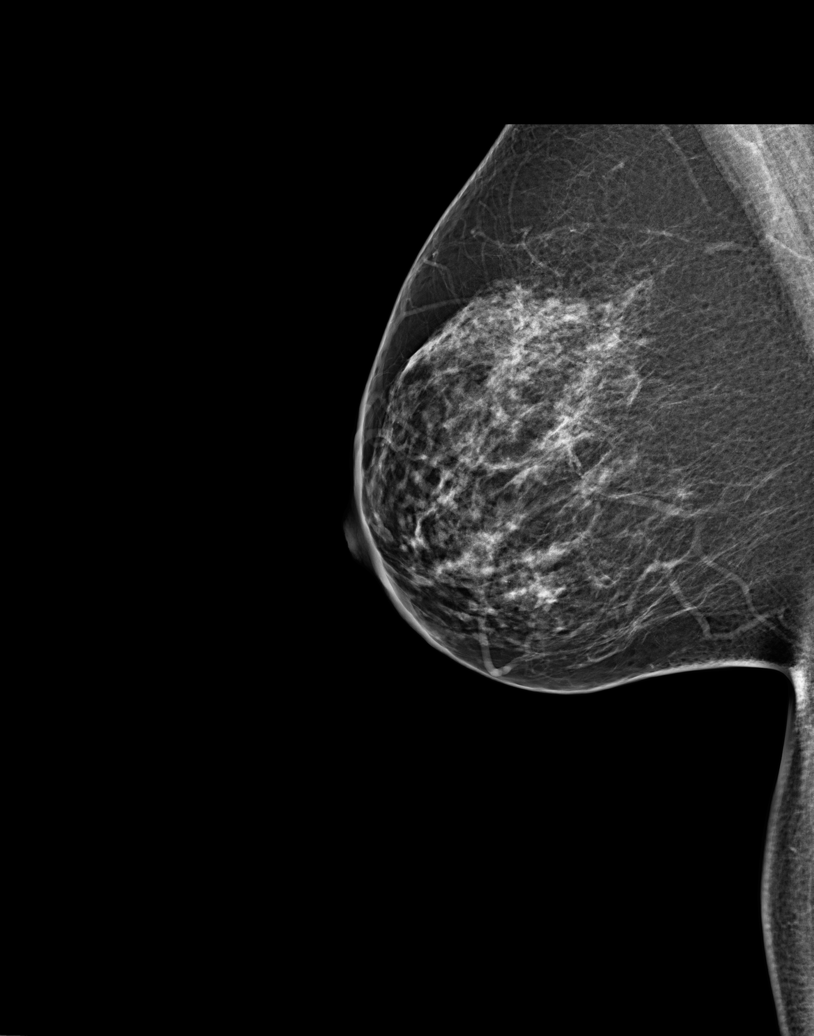

[R tomo (2 of 2)]
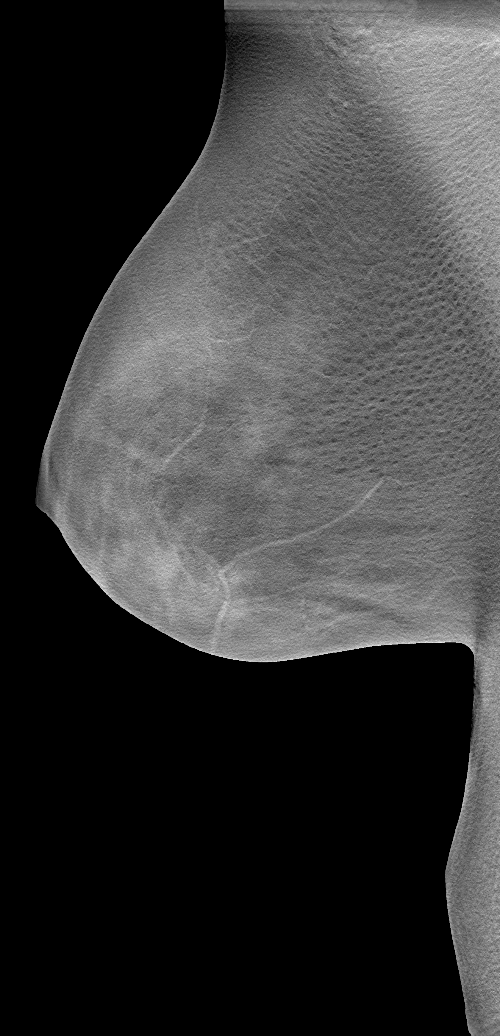

[L MLO]
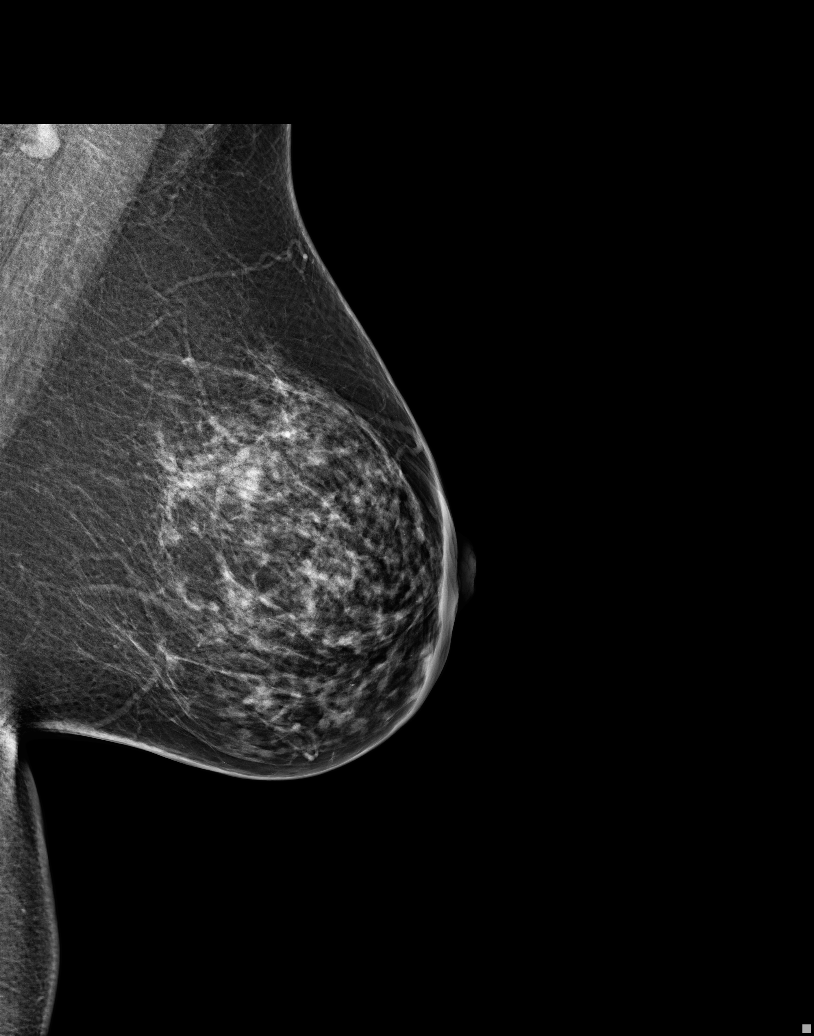

[L tomo (2 of 2)]
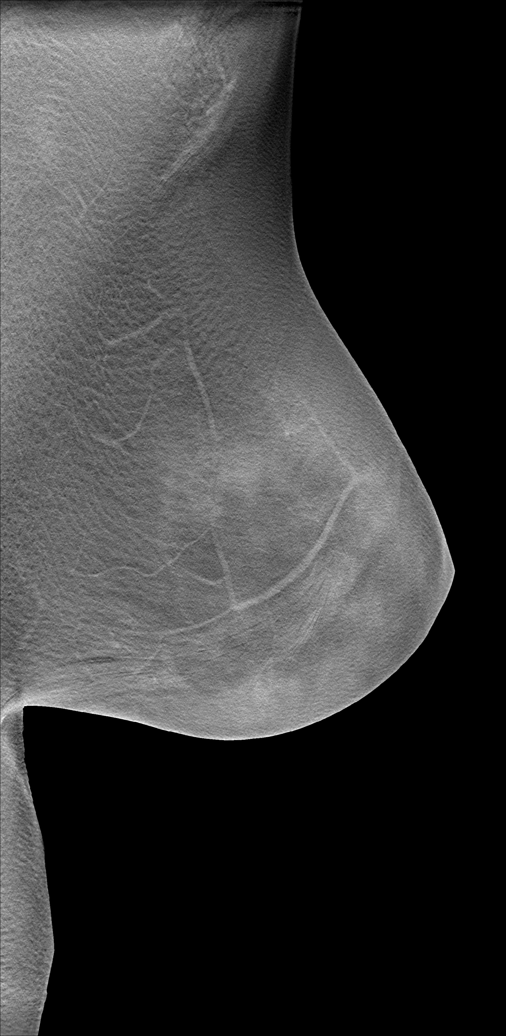

[8 of 8 positions shown; findings below may reference images not displayed]

EXAM

3D SCREENING MAMMOGRAM, BILATERAL

INDICATION

screening
TC SCORE: 10 YR RISK (April 2021) =  2.4%, LIFETIME RISK=  7.0%.  SCREENING.  AB (3D) PRIORS: 3033.

TECHNIQUE

Digital 2D CC and MLO projections obtained with 3D tomographic views per manufacturer's protocol.
ICAD version 7.2 was used during this exam.

COMPARISONS

Saturday April, 2020

FINDINGS

ACR Type 2:  25-50% There are scattered fibroglandular densities.

No concerning masses or calcifications are seen.

IMPRESSION

Stable mammography. One year follow-up is recommended.

BI-RADS 1, NEGATIVE.

Tech Notes:

## 2021-04-19 ENCOUNTER — Encounter: Admit: 2021-04-19 | Discharge: 2021-04-19 | Payer: BC Managed Care – PPO

## 2021-04-23 ENCOUNTER — Encounter: Admit: 2021-04-23 | Discharge: 2021-04-23 | Payer: BC Managed Care – PPO

## 2021-05-04 IMAGING — MR Head^Brain
10 series · 46 of 48 positions shown · non-contrast
Comparison: none

[Series 2: T1 · sagittal · 5.0mm · 0.45mm/px · 3 of 20 slices shown (1 of 2)]
[im 1/20]
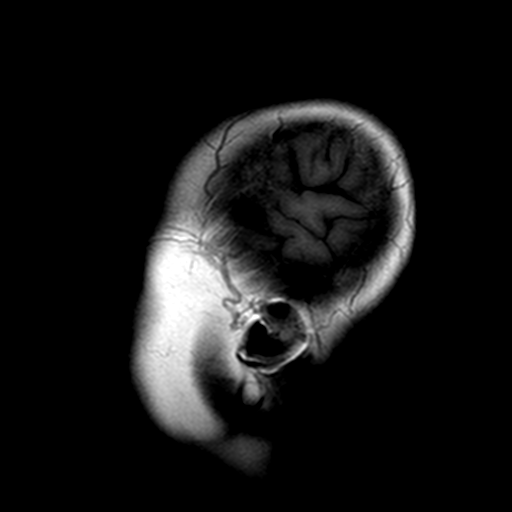
[im 10/20]
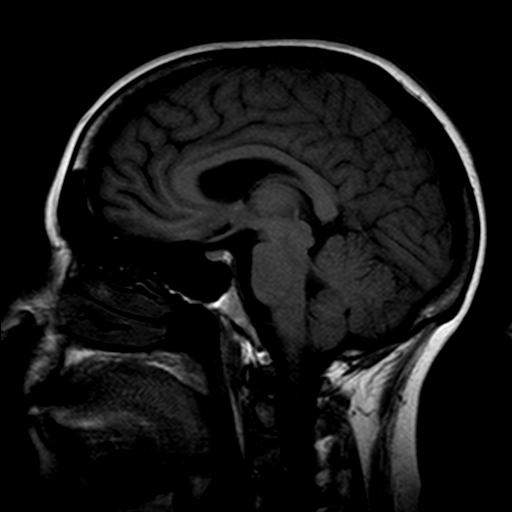
[im 20/20]
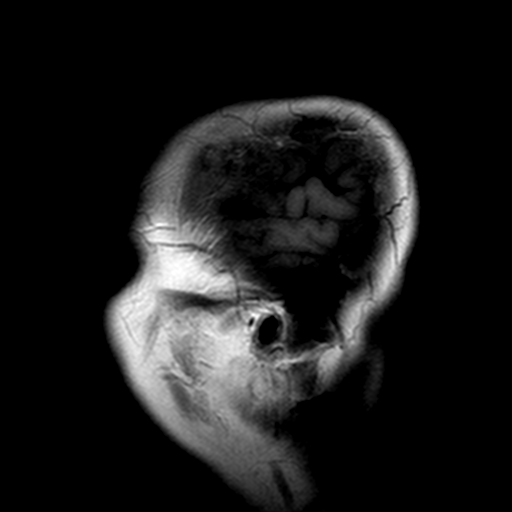

[Series 3: DWI · axial · 5.0mm · 1.80mm/px · z∈[-52,+75]mm · 11 of 62 slices shown (1 of 2)]
[im 1/62]
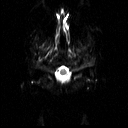
[im 7/62]
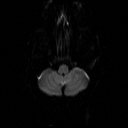
[im 13/62]
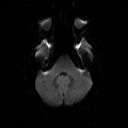
[im 19/62]
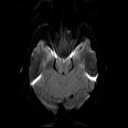
[im 25/62]
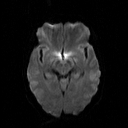
[im 31/62]
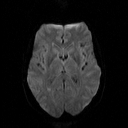
[im 37/62]
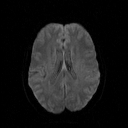
[im 43/62]
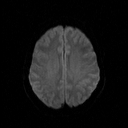
[im 49/62]
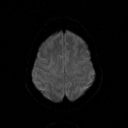
[im 55/62]
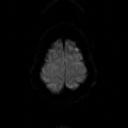
[im 62/62]
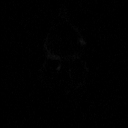

[Series 4: DWI · axial · 5.0mm · 1.80mm/px · z∈[-52,+75]mm · 4 of 21 slices shown (2 of 2)]
[im 1/21]
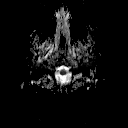
[im 7/21]
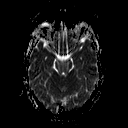
[im 14/21]
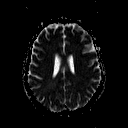
[im 21/21]
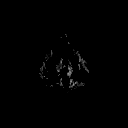

[Series 5: FLAIR · axial · 5.0mm · 0.45mm/px · z∈[-65,+82]mm · 4 of 24 slices shown]
[im 1/24]
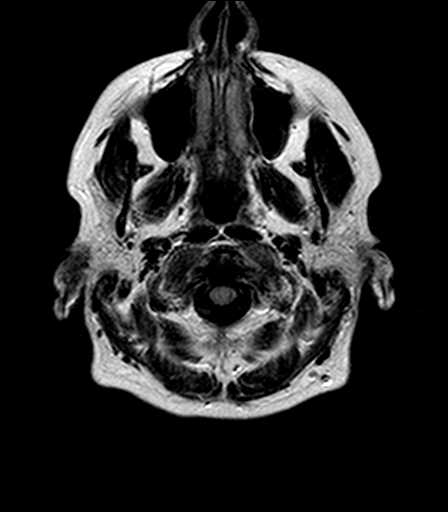
[im 8/24]
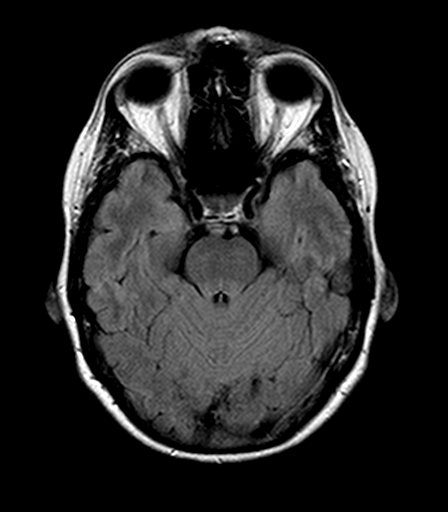
[im 16/24]
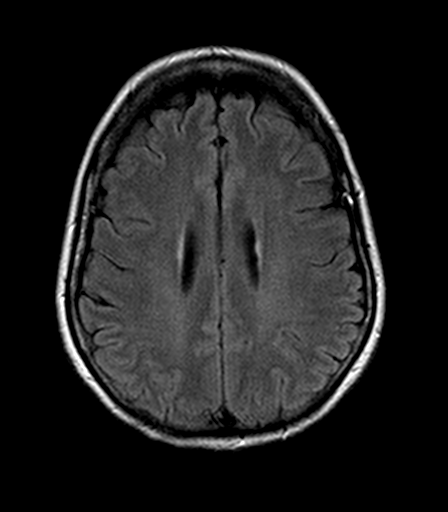
[im 24/24]
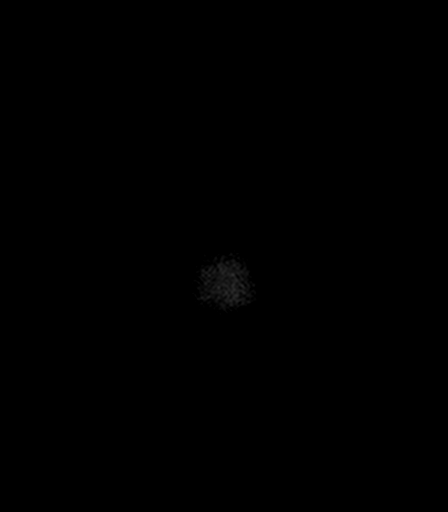

[Series 6: T2 · axial · 5.0mm · 0.72mm/px · z∈[-65,+82]mm · 4 of 24 slices shown (1 of 2)]
[im 1/24]
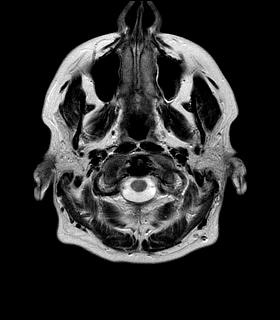
[im 8/24]
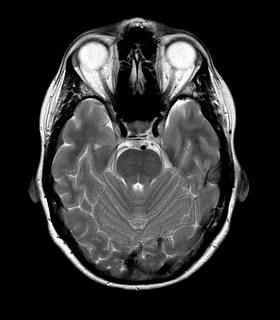
[im 16/24]
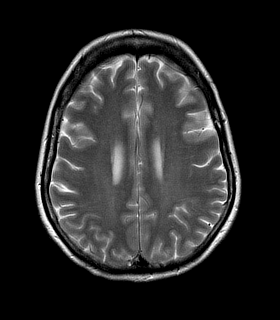
[im 24/24]
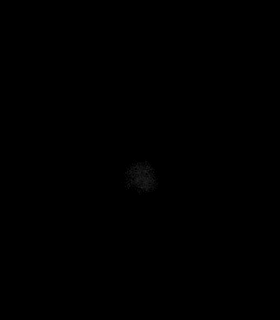

[Series 7: T1 · axial · 5.0mm · 0.45mm/px · z∈[-65,+82]mm · 4 of 24 slices shown (2 of 2)]
[im 1/24]
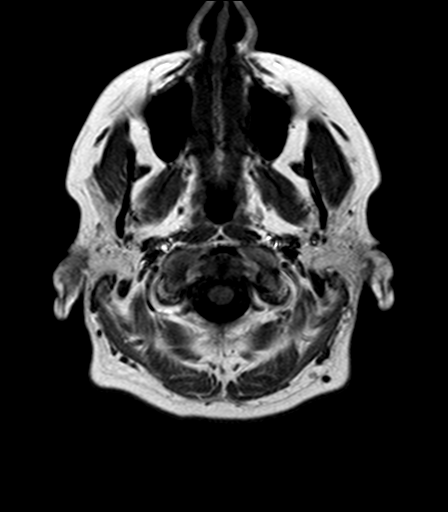
[im 8/24]
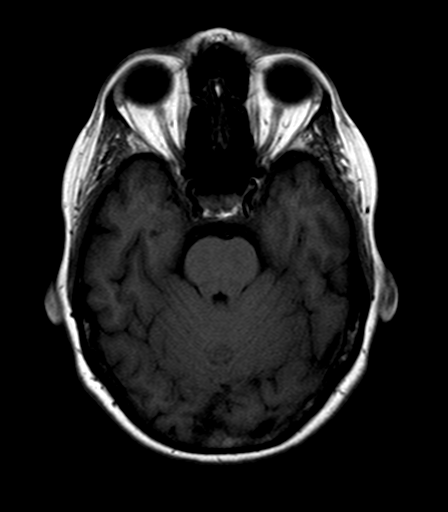
[im 16/24]
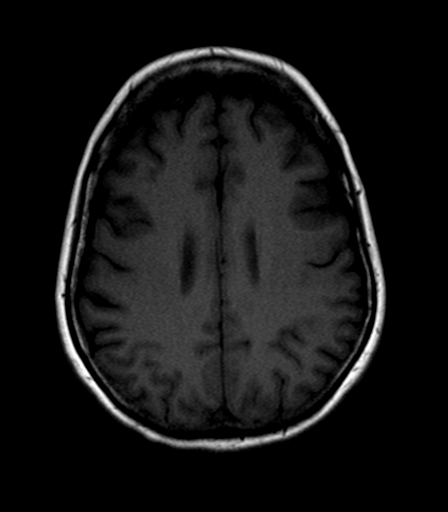
[im 24/24]
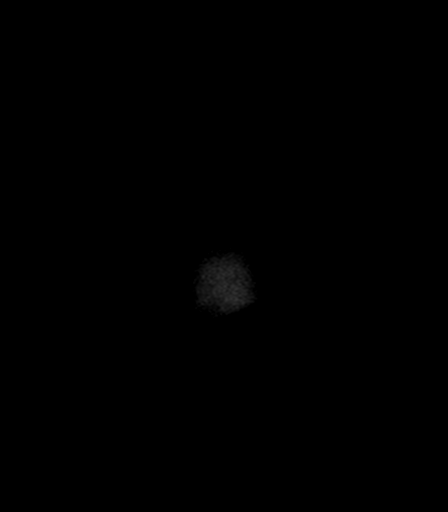

[Series 8: axial blood · axial · 5.0mm · 0.45mm/px · z∈[-65,-20]mm · 2 of 24 slices shown]
[im 1/24]
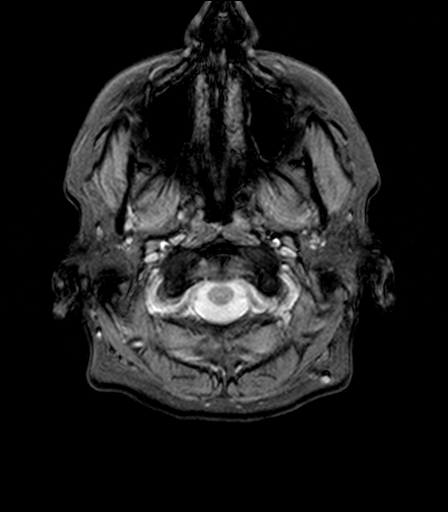
[im 8/24]
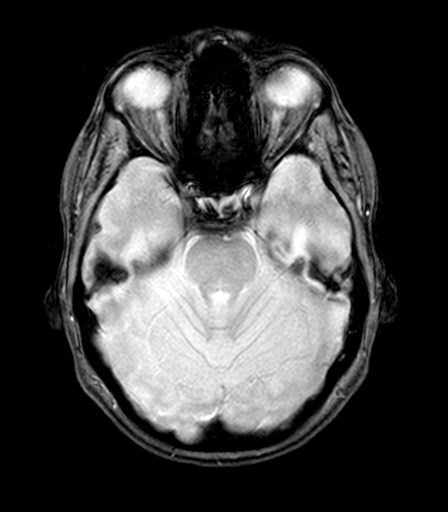

[Series 9: T2 · coronal · 5.0mm · 0.69mm/px · 5 of 26 slices shown (2 of 2)]
[im 1/26]
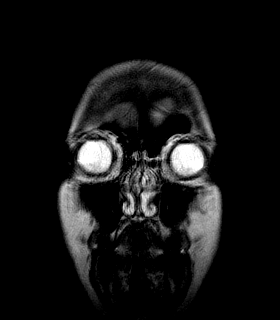
[im 7/26]
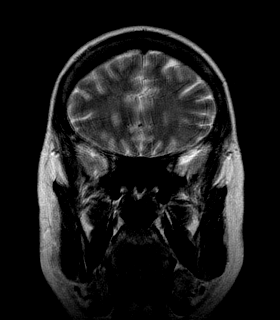
[im 13/26]
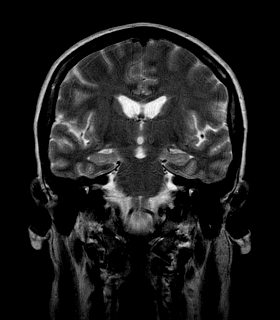
[im 19/26]
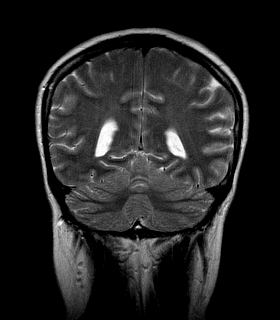
[im 26/26]
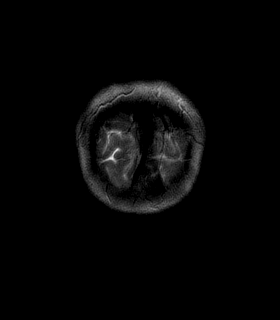

[Series 10: T1 fat-sat post-contrast · axial · 5.0mm · 0.90mm/px · z∈[-65,+82]mm · 4 of 24 slices shown (1 of 2)]
[im 1/24]
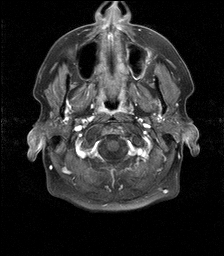
[im 8/24]
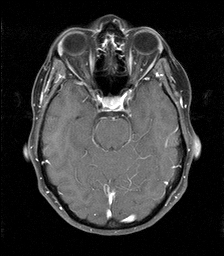
[im 16/24]
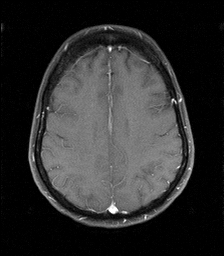
[im 24/24]
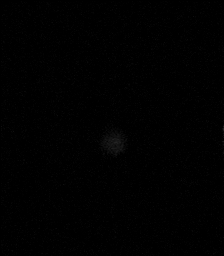

[Series 11: T1 fat-sat post-contrast · coronal · 5.0mm · 0.90mm/px · 5 of 26 slices shown (2 of 2)]
[im 1/26]
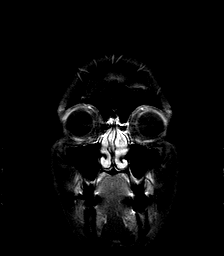
[im 7/26]
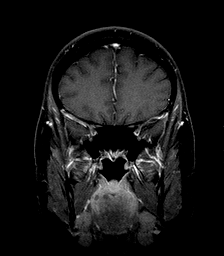
[im 13/26]
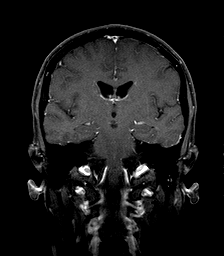
[im 19/26]
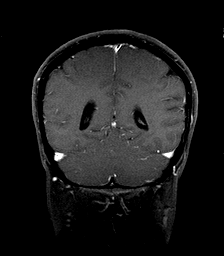
[im 26/26]
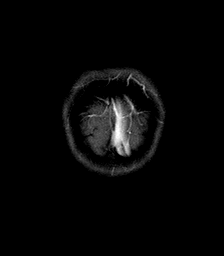

[46 of 48 positions shown; findings below may reference images not displayed]

DIAGNOSTIC STUDIES

EXAM

Brain MRI.

INDICATION

blurry vision, headache, confusion
DIZZINESS WITH HEADACHES AND VISUAL CHANGES THAT FOLLOW HEADACHES, NEW ONSET.  15 ML GADAVIST RG

TECHNIQUE

MRI of the brain with and without IV contrast. 15 cc Gadavist IV.

COMPARISONS

No prior studies are available for comparison.

FINDINGS

There is no diffusion restriction to suggest acute or subacute ischemia. The lateral ventricles and
other CSF spaces are symmetric in size and age appropriate. No midline shift. No mass effect. No
evidence of gradient blooming to suggest hemosiderin staining or T1 shortening to suggest acute
intracranial hemorrhage. Gray and white signal characteristics are well preserved on all pulsing
sequences without evidence of myelomalacia or edema. The major vascular flow voids are grossly
unremarkable. The paranasal sinuses are mostly clear with trace mucosal thickening noted. The
mastoid air cells are also mostly clear, though there is trace fluid signal in the inferior left
mastoid air cells. The orbits are symmetric. Mild right anterior nasal septal deviation.
Postcontrast imaging demonstrates no evidence of abnormal intra-axial or extra-axial enhancement.

IMPRESSION

Negative brain MRI.

Tech Notes:

DIZZINESS WITH HEADACHES AND VISUAL CHANGES THAT FOLLOW HEADACHES, NEW ONSET.  15 ML GADAVIST RG

## 2021-05-12 ENCOUNTER — Encounter: Admit: 2021-05-12 | Discharge: 2021-05-12 | Payer: BC Managed Care – PPO

## 2021-06-19 ENCOUNTER — Encounter: Admit: 2021-06-19 | Discharge: 2021-06-19 | Payer: BC Managed Care – PPO

## 2021-06-19 ENCOUNTER — Ambulatory Visit: Admit: 2021-06-19 | Discharge: 2021-06-19 | Payer: BC Managed Care – PPO

## 2021-06-19 DIAGNOSIS — R053 Chronic cough: Secondary | ICD-10-CM

## 2021-06-19 DIAGNOSIS — J679 Hypersensitivity pneumonitis due to unspecified organic dust: Secondary | ICD-10-CM

## 2021-06-19 DIAGNOSIS — R0989 Other specified symptoms and signs involving the circulatory and respiratory systems: Secondary | ICD-10-CM

## 2021-06-19 DIAGNOSIS — E039 Hypothyroidism, unspecified: Secondary | ICD-10-CM

## 2021-06-19 DIAGNOSIS — K9041 Non-celiac gluten sensitivity: Secondary | ICD-10-CM

## 2021-06-19 DIAGNOSIS — R112 Nausea with vomiting, unspecified: Secondary | ICD-10-CM

## 2021-06-19 DIAGNOSIS — K219 Gastro-esophageal reflux disease without esophagitis: Secondary | ICD-10-CM

## 2021-06-19 NOTE — Patient Instructions
Our GI team will contact  you to schedule your EGD (esophagogastroduodenoscopy) test to evaluate your lower esophagus and stomach.

## 2021-06-19 NOTE — Progress Notes
Date of Service: 06/19/2021    Subjective:             Danielle Garner is a 51 y.o. female.    History of Present Illness    Follow up globus and chronic cough.  Overall improved with much less coughing and lump in the throat sensation.  Still with occasional cough and globus.  Using medications and adhering to diet.       Review of Systems   Constitutional: Negative.    HENT: Positive for sore throat.    Eyes: Negative.    Respiratory: Positive for cough.    Cardiovascular: Negative.    Gastrointestinal: Negative.    Endocrine: Negative.    Genitourinary: Negative.    Musculoskeletal: Negative.    Skin: Negative.    Allergic/Immunologic: Negative.    Neurological: Negative.    Hematological: Negative.    Psychiatric/Behavioral: Negative.          Objective:         ? ADVAIR HFA 230-21 mcg/actuation inhaler    ? azelastine (ASTELIN) 137 mcg (0.1 %) nasal spray Apply two sprays to each nostril as directed twice daily.   ? buPROPion XL (WELLBUTRIN XL) 150 mg tablet Take 150 mg by mouth every morning.   ? cyclobenzaprine (FLEXERIL) 10 mg tablet Take 10 mg by mouth at bedtime daily.   ? EPINEPHrine (EPIPEN 2-PAK) 1 mg/mL injection pen (2-Pack) Inject 0.3 mg (1 Pen) into thigh if needed for anaphylactic reaction. May repeat in 5-15 minutes if needed.   ? famotidine (PEPCID) 40 mg tablet Take one tablet by mouth daily 30 minutes before dinner.   ? levothyroxine (SYNTHROID) 75 mcg tablet Take 75 mcg by mouth daily 30 minutes before breakfast.   ? loratadine (CLARITIN) 10 mg tablet Take 10 mg by mouth daily.   ? montelukast (SINGULAIR) 10 mg tablet    ? omeprazole DR (PRILOSEC) 40 mg capsule Take one capsule by mouth daily before breakfast. Take prior to meal   ? progesterone, micronized (PROMETRIUM) 100 mg capsule    ? spironolactone (ALDACTONE) 100 mg tablet    ? spironolactone (ALDACTONE) 50 mg tablet      Vitals:    06/19/21 1320   BP: 105/72   Pulse: 71   Temp: 36.4 ?C (97.6 ?F)   PainSc: Zero   Weight: 81.6 kg (180 lb)   Height: 152.4 cm (5')     Body mass index is 35.15 kg/m?Marland Kitchen     Physical Exam    General   Well-developed, well-nourished   Communication and Voice:  Clear pitch and clarity, age appropriate   Head and Face   Inspection:  Normocephalic and atraumatic without masses or lesions  Eyes   Nystagmus: None   EOM: Equal extraocular motion bilaterally  ENT   External nose:  No scar or anatomic deformity   Internal Nose:  Septum intact and midline.  No edema, polyps, or rhinorrhea   Lips, Teeth, and gums:  Mucosa and teeth intact and viable   Oral cavity/oropharynx:  No erythema or exudate, no nodules, lesions or masses noted  Ear   External canal:  Left - Canal is patent with intact skin                             Right - Canal is patent with intact skin   Tympanic Membranes:  Left - Clear and mobile  Right - Clear and mobile   Middle Ears:      Left - appears aerated, no effusion, no masses                           Right - appears aerated, no effusion, no masses  Respiratory   Respiratory effort:  Equal inspiration & expiration without use of accessory muscles. No  stridor  Neuro/Psych/Balance   Orientation: Patient oriented to person, place, and time   Affect: Appropriate mood and affect       Assessment and Plan:  Follow up globus and chronic cough.  Overall improved with much less coughing and lump in the throat sensation.  Still with occasional cough and globus.  Using medications and adhering to diet.    Continue current treatment plan and recommend EGD due to persistence of symptoms.    Follow up in 3-4 months.                      Total Time Today was 20 minutes in the following activities: Preparing to see the patient, Performing a medically appropriate examination and/or evaluation, Counseling and educating the patient/family/caregiver, Ordering medications, tests, or procedures and Documenting clinical information in the electronic or other health record

## 2021-06-19 NOTE — Pre-Anesthesia Patient Instructions
UPPER GI ENDOSCOPY   INSTRUCTIONS      Liese,     You are scheduled for an EGD on: 06/21/2021  at:  9:09 AM   With Dr. Francisco Capuchin, Stevphen Meuse, MD  You must arrive by:  8:10 AM     ** Please keep in mind that our surgery schedule is fluid, and affected by multiple factors, so it is possible that your procedure time could change.  We will make every effort to maintain your original procedure time, but there are no guarantees.  If your time changes, you could receive a call notifying you up until the afternoon prior to your procedure. Thank you for your patience and understanding!      **Please note the [facility] Center doors open at 6:00AM. If you arrive prior to 6AM, you will need to wait in your vehicle.     OUR ADDRESS IS [address] [place] [address]  [address] AND TURN LEFT.   TAKE A LEFT AT THE FIRST OR SECOND ENTRANCE AND FOLLOW SIGNAGE TO THE [facility]UL[facility]ORY SUR[facility]ERY CENTER ([address]).    To Reschedule call: [phone]  For Pre-Procedure or prep Questions call: PAT Phone #s: [phone] Mardella Layman, RN)  For questions on evenings/weekends, please call the [facility] after hours number [phone] and ask for the GI physician on-call.   [facility] main number: [phone] (receptionist, billing, insurance)    To notify us of an AFTER HOURS cancellation (during the weekend or night prior to procedure), please leave a voicemail at [phone] stating that you need to cancel.    ____________________________________________________________    Upper GI endoscopy allows healthcare providers to look directly into the beginning of your gastrointestinal(GI) tract.  The esophagus, stomach, and duodenum (first part of the small intestine) make up the upper GI tract.  - The doctor will speak to you before and after the procedure.  -- Unless you request otherwise, we will invite your driver to pre-op and to the recovery room to hear the provider's post-exam report. Please make your driver aware that the temperature of the surgery center is often kept cool and we advise dressing warm or bringing a jacket.  _____________________________________________________________________________________________________     DAY OF TEST:  You should have nothing to eat after midnight prior to your procedure.  You may drink clear liquids (water, tea, gatorade, or black coffee no cream or additives) until (4) hours before your scheduled procedure time. After this you should have nothing by mouth. This includes GUM or CANDY.   If you have an early morning test, take ONLY your essential morning medications (levothyroxine, omeprazole, and allergy med) with a small sip of water.  Bathe, brush teeth and gargle the morning of your procedure.   Wear comfortable, casual, loose fitting clothing that are easy to get on and off.  Please bring your cell phone with you.  Take off any jewelry, take out any piercings, and leave all other valuables at home.  If you wear glasses or contacts, please bring a case for their safekeeping.   You are scheduled to receive anesthesia with your procedure/surgery. For your safety, and the safety of others, a responsible adult (over the age of 14) is required to drive you home from your procedure and stay with you for 12 hours. We ask that your caregiver remain in or near the facility for the duration of your procedure. If your caregiver does not remain in the  facility, we require a working mobile phone number and, in some instances, the facility may contact your driver for pick-up confirmation before your procedure can begin. If you choose to use an alternate method of transportation such as an Pharmacist, community or taxi, a responsible adult is still required to accompany you to and from the facility. If you do not have someone to accompany you to and from our facility, we will be unable to complete your procedure/surgery.  You will be here for (2-3) hours from arrival time. You will not be able to return to work the same day if you have received sedation.   Please bring a list of your current medications and dosages with you. Please do not bring your pills or medication containers to your procedure.  Please bring a copy of your ID and insurance card along with any required copay/deductible    ____________________________________________________________________________________________________________________    KEEPING YOU SAFE    ** THE HEALTH AND SAFETY OF OUR PATIENTS, STAFF AND PHYSICIANS IS OUR TOP PRIORITY. TO REDUCE THE RISK OF POSSIBLE COVID-19 EXPOSURE WE ARE TAKING THE PROPER PRECAUTIONS LISTED BELOW    YOU ARE PERMITTED 1 VISITOR IN THE WAITING AREA.    ALL PATIENTS AND THEIR RESPONSIBLE ADULT (IF ENTERING THE FACILITY) WILL BE SCREENED ON THE DATE OF SERVICE UPON ARRIVAL AND BE REQUIRED TO WEAR A MASK WHILE INSIDE THE FACILITY. NO VISITORS UNDER THE AGE OF 18 WILL BE ALLOWED IN THE RECOVERY AREA.     NOTIFY YOUR SURGEON IF YOU HAVE ANY SIGNIFICANT HEALTH STATUS CHANGES OR SHOULD YOU BECOME ILL PRIOR TO SURGERY WITH FEVER (TEMP 100.4 FAHRENHEIT OR GREATER) AND/OR COUGH, DIFFICULTY BREATHING, CHILLS, MUSCLE PAIN, HEADACHE, SORE THROAT OR NEW ONSET LOSS OF TASTE OR SMELL, OR NEW SUSPECTED EXPOSURE TO COVID-19.    ___________________________________________________________________________________            Pre-Admissions Testing (PAT)  [facility]  [email or web address]  [address]   [place], [place] [address]  [phone] (Main)    PAT Phone #s: [phone] (PAT)  [email or web address]  An Affiliate of [facility]

## 2021-06-21 ENCOUNTER — Encounter: Admit: 2021-06-21 | Discharge: 2021-06-21 | Payer: BC Managed Care – PPO

## 2021-06-21 ENCOUNTER — Ambulatory Visit: Admit: 2021-06-21 | Discharge: 2021-06-21 | Payer: BC Managed Care – PPO

## 2021-06-21 DIAGNOSIS — E039 Hypothyroidism, unspecified: Secondary | ICD-10-CM

## 2021-06-21 DIAGNOSIS — K9041 Non-celiac gluten sensitivity: Secondary | ICD-10-CM

## 2021-06-21 DIAGNOSIS — R112 Nausea with vomiting, unspecified: Secondary | ICD-10-CM

## 2021-06-21 DIAGNOSIS — J679 Hypersensitivity pneumonitis due to unspecified organic dust: Secondary | ICD-10-CM

## 2021-06-21 DIAGNOSIS — R053 Chronic cough: Secondary | ICD-10-CM

## 2021-06-21 MED ORDER — PROPOFOL 10 MG/ML IV EMUL 50 ML (INFUSION)(AM)(OR)
INTRAVENOUS | 0 refills | Status: DC
Start: 2021-06-21 — End: 2021-06-21

## 2021-06-21 MED ORDER — LIDOCAINE (PF) 100 MG/5 ML (2 %) IV SYRG
INTRAVENOUS | 0 refills | Status: DC
Start: 2021-06-21 — End: 2021-06-21

## 2021-06-21 MED ORDER — PROPOFOL INJ 10 MG/ML IV VIAL
INTRAVENOUS | 0 refills | Status: DC
Start: 2021-06-21 — End: 2021-06-21

## 2021-06-21 NOTE — Anesthesia Pre-Procedure Evaluation
Anesthesia Pre-Procedure Evaluation    Name: Danielle Garner      MRN: [id number]     DOB: Oct 09, 1970     Age: 51 y.o.     Sex: female   _________________________________________________________________________     Procedure Info:   Procedure Information     Date/Time: 06/21/21 0909    Procedure: ESOPHAGOGASTRODUODENOSCOPY WITH SPECIMEN COLLECTION BY BRUSHING/ WASHING    [facility]    Surgeons: Veneta Penton, MD          Physical Assessment  Vital Signs (last filed in past 24 hours):  BP: 109/60 (02/16 0839)  Temp: 36.5 ?C (97.7 ?F) (02/16 4540)  Pulse: 71 (02/16 0839)  Respirations: 14 PER MINUTE (02/16 0839)  SpO2: 96 % (02/16 0839)  O2 Device: None (Room air) (02/16 0839)  Height: 167.6 cm (5' 6) (02/16 9811)  Weight: 83.5 kg (184 lb) (02/16 9147)      Patient History   Allergies   Allergen Reactions   ? Asa [Aspirin] SEE COMMENTS     Nose bleeds          Current Medications    Medication Directions   ADVAIR HFA 230-21 mcg/actuation inhaler    azelastine (ASTELIN) 137 mcg (0.1 %) nasal spray Apply two sprays to each nostril as directed twice daily.   buPROPion XL (WELLBUTRIN XL) 150 mg tablet Take one tablet by mouth every morning.   cyclobenzaprine (FLEXERIL) 10 mg tablet Take 10 mg by mouth at bedtime daily.   EPINEPHrine (EPIPEN 2-PAK) 1 mg/mL injection pen (2-Pack) Inject 0.3 mg (1 Pen) into thigh if needed for anaphylactic reaction. May repeat in 5-15 minutes if needed.   famotidine (PEPCID) 40 mg tablet Take one tablet by mouth daily 30 minutes before dinner.   levothyroxine (SYNTHROID) 75 mcg tablet Take one tablet by mouth daily 30 minutes before breakfast.   loratadine (CLARITIN) 10 mg tablet Take 10 mg by mouth daily.   montelukast (SINGULAIR) 10 mg tablet    omeprazole DR (PRILOSEC) 40 mg capsule Take one capsule by mouth daily before breakfast. Take prior to meal   progesterone, micronized (PROMETRIUM) 100 mg capsule    spironolactone (ALDACTONE) 100 mg tablet spironolactone (ALDACTONE) 50 mg tablet          Review of Systems/Medical History      Patient summary reviewed  Nursing notes reviewed  Pertinent labs reviewed    PONV Screening: Non-smoker and Hx PONV/motion sickness  History of anesthetic complications (PONV)      Airway         Chronic cough with multiple contributors.  She has her nasal drainage and hypersensitivity pneumonitis under what appears to be reasonable control. 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  Normal valve structure and function.  Normal pulmonary artery pressures.  No interatrial shunt.  Nonischemic exercise stress electrocardiogram.  No stress induced ectopy or dysrhythmias.  Negative stress echo without evidence of inducible ischemia.      GI/Hepatic/Renal         GERD,         Neuro/Psych - negative  Endocrine/Other         Hypothyroidism     Physical Exam    Airway Findings      Mallampati: I      TM distance: >3 FB      Neck ROM: full      Mouth opening: good      Airway patency: adequate    Dental Findings: Negative      Cardiovascular Findings:       Rhythm: regular      Rate: normal    Pulmonary Findings:       Breath sounds clear to auscultation.    Neurological Findings:       Alert and oriented x 3    Constitutional findings:       No acute distress      Well-developed      Well-nourished       Diagnostic Tests  Hematology: No results found for: HGB, HCT, PLTCT, WBC, NEUT, ANC, LYMPH, ALC, ABSLYMPHCT, MONA, AMC, EOSA, ABC, BASOPHILS, MCV, MCH, MCHC, MPV, RDW      General Chemistry: No results found for: NA, K, CL, CO2, GAP, BUN, CR, GLU, CA, KETONES, ALBUMIN, LACTIC, OBSCA, MG, TOTBILI, TOTBILCB, PO4   Coagulation: No results found for: PT, PTT, INR      Anesthesia Plan    ASA score: 3   Plan: MAC  Induction method: intravenous  NPO status: acceptable      Informed Consent  Anesthetic plan and risks discussed with patient.

## 2021-06-21 NOTE — Anesthesia Post-Procedure Evaluation
Post-Anesthesia Evaluation    Name: Danielle Garner      MRN: [id number]     DOB: Jul 08, 1970     Age: 51 y.o.     Sex: female   __________________________________________________________________________     Procedure Information     Anesthesia Start Date/Time: 06/21/21 0921    Procedures:       ESOPHAGOGASTRODUODENOSCOPY WITH SPECIMEN COLLECTION BY BRUSHING/ WASHING (Esophagus)      ESOPHAGOGASTRODUODENOSCOPY WITH BIOPSY - FLEXIBLE (Esophagus)    Location: [facility] / [facility] OR    Surgeons: Lanney Gins, MD          Post-Anesthesia Vitals  BP: [id number] (02/16 1115)  Temp: 36.6 C (97.9 F) (02/16 0937)  Pulse: 69 (02/16 1123)  Respirations: 13 PER MINUTE (02/16 1123)  SpO2: 100 % (02/16 1123)  SpO2 Pulse: 69 (02/16 1123)  O2 Device: None (Room air) (02/16 1015)  Height: 167.6 cm (5\' 6" ) (02/16 0839)   Vitals Value Taken Time   BP 111/69 06/21/21 1115   Temp 36.6 C (97.9 F) 06/21/21 0937   Pulse 69 06/21/21 1123   Respirations 13 PER MINUTE 06/21/21 1123   SpO2 100 % 06/21/21 1123   O2 Device None (Room air) 06/21/21 1015   ABP     ART BP           Post Anesthesia Evaluation Note    Evaluation location: pre/post  Patient participation: recovered; patient participated in evaluation  Level of consciousness: alert    Pain score: 2  Pain management: adequate    Hydration: normovolemia  Temperature: 36.0C - 38.4C  Airway patency: adequate    Perioperative Events      Postoperative Status  Cardiovascular status: hemodynamically stable  Respiratory status: spontaneous ventilation        Perioperative Events  There were no known notable events for this encounter.

## 2021-06-22 ENCOUNTER — Encounter: Admit: 2021-06-22 | Discharge: 2021-06-22 | Payer: BC Managed Care – PPO

## 2021-06-22 DIAGNOSIS — K9041 Non-celiac gluten sensitivity: Secondary | ICD-10-CM

## 2021-06-22 DIAGNOSIS — R053 Chronic cough: Secondary | ICD-10-CM

## 2021-06-22 DIAGNOSIS — R112 Nausea with vomiting, unspecified: Secondary | ICD-10-CM

## 2021-06-22 DIAGNOSIS — E039 Hypothyroidism, unspecified: Secondary | ICD-10-CM

## 2021-06-22 DIAGNOSIS — J679 Hypersensitivity pneumonitis due to unspecified organic dust: Secondary | ICD-10-CM

## 2021-06-25 ENCOUNTER — Encounter: Admit: 2021-06-25 | Discharge: 2021-06-25 | Payer: BC Managed Care – PPO

## 2021-08-03 ENCOUNTER — Encounter: Admit: 2021-08-03 | Discharge: 2021-08-03 | Payer: BC Managed Care – PPO

## 2021-08-10 ENCOUNTER — Encounter: Admit: 2021-08-10 | Discharge: 2021-08-10 | Payer: BC Managed Care – PPO

## 2021-08-10 DIAGNOSIS — R112 Nausea with vomiting, unspecified: Secondary | ICD-10-CM

## 2021-08-10 DIAGNOSIS — R519 Headache: Secondary | ICD-10-CM

## 2021-08-10 DIAGNOSIS — E039 Hypothyroidism, unspecified: Secondary | ICD-10-CM

## 2021-08-10 DIAGNOSIS — H538 Other visual disturbances: Secondary | ICD-10-CM

## 2021-08-10 DIAGNOSIS — R053 Chronic cough: Secondary | ICD-10-CM

## 2021-08-10 DIAGNOSIS — R42 Dizziness and giddiness: Secondary | ICD-10-CM

## 2021-08-10 DIAGNOSIS — J679 Hypersensitivity pneumonitis due to unspecified organic dust: Secondary | ICD-10-CM

## 2021-08-10 DIAGNOSIS — K9041 Non-celiac gluten sensitivity: Secondary | ICD-10-CM

## 2021-08-10 NOTE — Telephone Encounter
Called and spoke to patient on 04.07.2023. Patient is aware of date/time and location of appointment.    Initial Visit Date:   -04.12.2023    Reason for Visit:   -Dizziness, blurry vision and headaches    Is this as a result of an injury?  -No     Physician Information    PCP:   -Rockwell GermanyMelissa Huntington, PA ([facility] [place])  Fax #: [phone]    Referring Physician:   Efraim Kaufmann-[name], PA    Previous Neurologist:   -No previous neurologist    Previous Imaging/Procedures:  -[facility]    ED/Hospitalizations:    -Per pt, none as of recent    Medications, Allergies, History Updated    Records:  -No records on file to review at this time    Records Requested:  -[name]'s notes  -[facility]

## 2021-08-14 NOTE — Progress Notes
Subjective:       History of Present Illness  Danielle Garner is a 51 y.o. female.    Initial Visit Date:   -04.12.2023  ?  Reason for Visit:   -Dizziness, blurry vision and headaches  ?  Is this as a result of an injury?  -No   ?  Physician Information  ?  PCP:   -Rockwell Germany, PA [facility] [place])  Fax #: [phone]  ?  Referring Physician:   -Efraim Kaufmann Huntington, PA  ?  Previous Neurologist:   -No previous neurologist  ?  Previous Imaging/Procedures:  -[facility]  ?  ED/Hospitalizations:    -Per pt, none as of recent  ?  Medications, Allergies, History Updated  ?  Records:  -No records on file to review at this time  ?  Records Requested:  -Danielle Garner's notes  -Images completed at [facility]    Spells + headache   Onset: 2022, insidious. She will hold her breath for 5 seconds or so (involuntary)     Location: bitemporal   Quality: pressure-like   Severity: 3-7/10  Total headache days per month: 15   Duration: 5 seconds   Associated symptoms: blurred vision, tunnel vision    Denies: lacrimation/rhinorrhea, positional changes, ringing in ears/roaring sounds   Aura: no   Triggers: occurs exclusively in the upright position, while doing chores around the house   Sleep/snoring: muscle tension, neck pan, grind her teeth, takes methocarbamol to help her sleep  Hx of head injury: no   Family hx of migraine: no  Last Eye exam: annual   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     She reports good oral intake. She does not add salt to her food. She denies LOC.     Relevant PMHx:  1. Anxiety/depression   2. Chronic headaches   3. Dizziness   4. Menopause on HRT   5. Chronic neck and muscle tension   6. Seasonal allergies   7. Chronic cough related silent reflux     Prior Workup:   1. [facility] Head 05/04/21: normal __________________________________________________________________________________    Medical History:   Diagnosis Date   ? Blurry vision    ? Chronic cough    ? Dizziness    ? Generalized headaches    ? Gluten intolerance    ? Headache    ? Hypersensitivity pneumonitis (HCC)    ? Hypothyroidism    ? PONV (postoperative nausea and vomiting)    ? Vision decreased      Surgical History:   Procedure Laterality Date   ? LUNG BIOPSY Right 02/03/2009   ? TENOTOMY Left 02/08/2016    arm-elbow   ? ESOPHAGOGASTRODUODENOSCOPY WITH SPECIMEN COLLECTION BY BRUSHING/ WASHING N/A 06/21/2021    Performed by Veneta Penton, [facility] at [facility] OR   ? ESOPHAGOGASTRODUODENOSCOPY WITH BIOPSY - FLEXIBLE N/A 06/21/2021    Performed by Veneta Penton, MD at [facility] OR   ? HX BACK SURGERY       Social History     Tobacco Use   ? Smoking status: Former     Types: Cigarettes   ? Smokeless tobacco: Never   ? Tobacco comments:     Quit smoking 79yrs ago   Substance Use Topics   ? Alcohol use: Yes     Comment: weekends   ? Drug use:  Not Currently     Family History   Problem Relation Age of Onset   ? Diabetes Type II Father      Allergies   Allergen Reactions   ? Gluten DIARRHEA, FLATULENCE, NAUSEA AND VOMITING, SEE COMMENTS, RASH and STOMACH UPSET     Digestive problems and rash     ? Almond UNKNOWN   ? Asa [Aspirin] SEE COMMENTS     Nose bleeds     ? Egg UNKNOWN     Review of Systems  ROS negative unless otherwise specified in HPI    Objective:         ? ADVAIR HFA 230-21 mcg/actuation inhaler    ? ALPRAZolam (XANAX) 0.25 mg tablet    ? buPROPion XL (WELLBUTRIN XL) 150 mg tablet Take one tablet by mouth every morning.   ? cyclobenzaprine (FLEXERIL) 10 mg tablet Take one tablet by mouth at bedtime daily.   ? EPINEPHrine (EPIPEN 2-PAK) 1 mg/mL injection pen (2-Pack) Inject 0.3 mg (1 Pen) into thigh if needed for anaphylactic reaction. May repeat in 5-15 minutes if needed.   ? famotidine (PEPCID) 40 mg tablet Take one tablet by mouth daily 30 minutes before dinner.   ? levothyroxine (SYNTHROID) 75 mcg tablet Take one tablet by mouth daily 30 minutes before breakfast.   ? methocarbamoL (ROBAXIN) 750 mg tablet    ? montelukast (SINGULAIR) 10 mg tablet    ? omeprazole DR (PRILOSEC) 40 mg capsule Take one capsule by mouth daily before breakfast. Take prior to meal   ? progesterone, micronized (PROMETRIUM) 100 mg capsule    ? spironolactone (ALDACTONE) 100 mg tablet    ? spironolactone (ALDACTONE) 50 mg tablet      Vitals:    08/15/21 1404   BP: 114/77   BP Source: Arm, Left Upper   Pulse: 69   SpO2: 98%   PainSc: Three   Weight: 81.6 kg (180 lb)   Height: 152.4 cm (5')     Body mass index is 35.15 kg/m?Danielle Garner     General: alert, oriented x 3  Speech: normal, no dysarthria  Cardiovascular: regular rate and rhythm  Lungs: resting comfortably on RA   Ext: no leg edema  Cranial nerves: II Visual fields full to finger counting; III, IV, VI PERRL, extraocular muscles intact, no nystagmus; V facial sensation intact; VII facial expression symmetric; VIII hearing intact to conversation; IX, X palate rise symmetric; XI sternocleidomastoid strength intact; XII tongue midline, no fasciculations present  Motor: (Right/Left)  Deltoid 5/5, Biceps 5/5, Triceps 5/5, Finger ext 5/5, interossei 5/5, Hip Flexion 5/5, Knee ext 5/5, Knee flex 5/5, Ankle dorsiflexion 5/5, Ankle plantarflexion 5/5  Sensory: Normal to light touch. Proprioception intact at toes. Pinprick is normal. Vibration in seconds R/L toes  Coordination: normal finger nose finger, rapid alternating movements and finger tapping speed normal, and heel to shin smooth without ataxia  Reflexes: (Right/Left) Biceps 2/2, Triceps 2/2, Brachioradialis 2/2, Knee Jerks 2/2, Ankle Jerks 2/2, Toes downgoing  Abnormal Movements: no tremors, no rigidity, no bradykinesia  Gait: normal based, good arm swing, Toe/heel walking without assistance, Tandem gait intact      ASSESSMENT/PLAN:  CLINICAL SUMMARY      BINA [name] 51 y.o. female with notable history of hypersensitivity pneumonitis, chronic allergies, menopause on HRT here today for evaluation of recurrent spells of dizziness and headache.     Spells began insidiously about 1 year ago. These occur about 15x per month and consist of involuntarily holding  her breath for 4-5 seconds followed by a mild-moderate bitemporal headache, vision blurring + tunnel vision and dizziness lasting 5-10 seconds without interruption in consciousness and immediately returns to baseline. This tends to occur while she is upright and exerting herself doing household chores. This does not occur in a lying position. Neuro exam is unremarkable today. MRI head in 04/2021 is normal.     We discussed my impression of breath holding likely causative of presyncope. We discussed optimization of fluids, salt intake, compression stockings, crossing legs/tensing muscle prior to position changes. Will obtain echo, CTA head and neck to look for vertebrobasilar insufficiency. We also discuss treatment options for breath holding. I asked that she also speak to her Pulmonologist about these symptoms. Neurologic diagnostic considerations include motor tics and autonomic dysfunction. Will order autonomic testing and tilt table testing to investigate further. Consider trial of guanfacine or tetrabenazine.     Impression:  1. Postural dizziness with presyncope    2. Breath holding episodes      RECOMMENDATIONS  1. Autonomic with tilt table testing   2. CTA Head and Neck   3. Echocardiogram   4. Consider sleep study to see if breath holding persists in sleep   5. Consider Cardiopulmonary Exercise Testing in Adults   6. Reduce spironolactone as able   7. Liberalize salt intake, adequate oral hydration, muscle tensing with position changes     MANAGEMENT AND PLAN  During the visit I discussed my impression, recommended diagnostic studies, prognosis, risks and benefits of management, instructions for management, and importance of compliance.  After a discussion, the patient agrees with the plan.     Total Time Today was 60 minutes in the following activities: Preparing to see the patient, Obtaining and/or reviewing separately obtained history, Performing a medically appropriate examination and/or evaluation, Counseling and educating the patient/family/caregiver and Ordering medications, tests, or procedures    FOLLOWUP PLAN  Return in about 3 months (around 11/14/2021).  The patient is instructed to contact me if there are any concerns with the agreed plan.    Barb Merino, MD  Clinical Assistant Professor   Department of Neurology  [facility]

## 2021-08-15 ENCOUNTER — Encounter: Admit: 2021-08-15 | Discharge: 2021-08-15 | Payer: BC Managed Care – PPO

## 2021-08-15 ENCOUNTER — Ambulatory Visit: Admit: 2021-08-15 | Discharge: 2021-08-16 | Payer: BC Managed Care – PPO

## 2021-08-15 VITALS — BP 114/77 | HR 69 | Ht 60.0 in | Wt 180.0 lb

## 2021-08-15 DIAGNOSIS — R0689 Other abnormalities of breathing: Secondary | ICD-10-CM

## 2021-08-15 DIAGNOSIS — E039 Hypothyroidism, unspecified: Secondary | ICD-10-CM

## 2021-08-15 DIAGNOSIS — R053 Chronic cough: Secondary | ICD-10-CM

## 2021-08-15 DIAGNOSIS — H538 Other visual disturbances: Secondary | ICD-10-CM

## 2021-08-15 DIAGNOSIS — R519 Headache: Secondary | ICD-10-CM

## 2021-08-15 DIAGNOSIS — R42 Dizziness and giddiness: Secondary | ICD-10-CM

## 2021-08-15 DIAGNOSIS — R112 Nausea with vomiting, unspecified: Secondary | ICD-10-CM

## 2021-08-15 DIAGNOSIS — H547 Unspecified visual loss: Secondary | ICD-10-CM

## 2021-08-15 DIAGNOSIS — J679 Hypersensitivity pneumonitis due to unspecified organic dust: Secondary | ICD-10-CM

## 2021-08-15 DIAGNOSIS — K9041 Non-celiac gluten sensitivity: Secondary | ICD-10-CM

## 2021-08-15 NOTE — Telephone Encounter
Scheduled patient for ANS with Tilt Table, gave instructions and answered questions.

## 2021-08-16 DIAGNOSIS — R55 Syncope and collapse: Secondary | ICD-10-CM

## 2021-08-16 DIAGNOSIS — R42 Dizziness and giddiness: Secondary | ICD-10-CM

## 2021-08-16 DIAGNOSIS — G450 Vertebro-basilar artery syndrome: Principal | ICD-10-CM

## 2021-08-21 ENCOUNTER — Encounter: Admit: 2021-08-21 | Discharge: 2021-08-21 | Payer: BC Managed Care – PPO

## 2021-08-31 ENCOUNTER — Encounter: Admit: 2021-08-31 | Discharge: 2021-08-31 | Payer: BC Managed Care – PPO

## 2021-09-25 ENCOUNTER — Encounter: Admit: 2021-09-25 | Discharge: 2021-09-25 | Payer: BC Managed Care – PPO

## 2021-09-26 ENCOUNTER — Encounter: Admit: 2021-09-26 | Discharge: 2021-09-26 | Payer: BC Managed Care – PPO

## 2021-09-26 ENCOUNTER — Ambulatory Visit: Admit: 2021-09-26 | Discharge: 2021-09-26 | Payer: BC Managed Care – PPO

## 2021-09-26 DIAGNOSIS — R42 Dizziness and giddiness: Secondary | ICD-10-CM

## 2021-09-26 MED ORDER — PERFLUTREN LIPID MICROSPHERES 1.1 MG/ML IV SUSP
1-10 mL | Freq: Once | INTRAVENOUS | 0 refills | Status: CP | PRN
Start: 2021-09-26 — End: ?
  Administered 2021-09-26: 17:00:00 1 mL via INTRAVENOUS

## 2021-09-26 NOTE — Telephone Encounter
Called patient to discuss Autonomic and Tilt Table instructions. PT was busy but will review mychart message and call if there are any questions.

## 2021-10-09 ENCOUNTER — Encounter: Admit: 2021-10-09 | Discharge: 2021-10-09 | Payer: BC Managed Care – PPO

## 2021-10-09 ENCOUNTER — Ambulatory Visit: Admit: 2021-10-09 | Discharge: 2021-10-09 | Payer: BC Managed Care – PPO

## 2021-10-09 DIAGNOSIS — R42 Dizziness and giddiness: Secondary | ICD-10-CM

## 2021-10-09 MED ORDER — SODIUM CHLORIDE 0.9 % IV SOLP
1000 mL | INTRAVENOUS | 0 refills | Status: CP
Start: 2021-10-09 — End: ?

## 2021-10-11 ENCOUNTER — Encounter: Admit: 2021-10-11 | Discharge: 2021-10-11 | Payer: BC Managed Care – PPO

## 2021-10-15 ENCOUNTER — Ambulatory Visit: Admit: 2021-10-15 | Discharge: 2021-10-15 | Payer: BC Managed Care – PPO

## 2021-10-15 ENCOUNTER — Encounter: Admit: 2021-10-15 | Discharge: 2021-10-15 | Payer: BC Managed Care – PPO

## 2021-10-15 DIAGNOSIS — G450 Vertebro-basilar artery syndrome: Secondary | ICD-10-CM

## 2021-10-15 MED ORDER — IOHEXOL 350 MG IODINE/ML IV SOLN
100 mL | Freq: Once | INTRAVENOUS | 0 refills | Status: CP
Start: 2021-10-15 — End: ?
  Administered 2021-10-15: 20:00:00 100 mL via INTRAVENOUS

## 2021-10-15 MED ORDER — SODIUM CHLORIDE 0.9 % IJ SOLN
50 mL | Freq: Once | INTRAVENOUS | 0 refills | Status: CP
Start: 2021-10-15 — End: ?
  Administered 2021-10-15: 20:00:00 50 mL via INTRAVENOUS

## 2021-11-01 ENCOUNTER — Encounter: Admit: 2021-11-01 | Discharge: 2021-11-01 | Payer: BC Managed Care – PPO

## 2021-11-01 MED ORDER — OMEPRAZOLE 40 MG PO CPDR
ORAL_CAPSULE | 3 refills | Status: AC
Start: 2021-11-01 — End: ?

## 2021-11-02 ENCOUNTER — Encounter: Admit: 2021-11-02 | Discharge: 2021-11-02 | Payer: BC Managed Care – PPO

## 2021-11-21 ENCOUNTER — Encounter: Admit: 2021-11-21 | Discharge: 2021-11-21 | Payer: BC Managed Care – PPO

## 2021-11-23 ENCOUNTER — Encounter: Admit: 2021-11-23 | Discharge: 2021-11-23 | Payer: BC Managed Care – PPO

## 2021-11-23 NOTE — Telephone Encounter
11/23/21 Records request faxed per workqueue task below. Danielle Garner [phone] sdc     Collect records from Dr. Alona Bene

## 2021-12-28 ENCOUNTER — Encounter: Admit: 2021-12-28 | Discharge: 2021-12-28 | Payer: BC Managed Care – PPO

## 2021-12-31 NOTE — Progress Notes
Subjective:       History of Present Illness  Danielle Garner is a 51 y.o. female presenting to Neurology clinic for follow up of autonomic dysfunction.     In the interim, she underwent tilt table testing found positive for dysautonomia. She was started on midodrine with some improvement. However at higher doses, she developed swelling. She was continued on 2.5 mg BID. We also discussed trial of fludrocortisone.     She reports she does feel like herself on the midodrine.     She has endorse rare periods of kicking in her sleep with dreams. She has poor sense of smell. She denies issues with tremors, muscle rigidity, falls, or slowness of movement.     Clinical summary at last visit 08/15/21:   Spells began insidiously about 1 year ago. These occur about 15x per month and consist of involuntarily holding her breath for 4-5 seconds followed by a mild-moderate bitemporal headache, vision blurring + tunnel vision and dizziness lasting 5-10 seconds without interruption in consciousness and immediately returns to baseline. This tends to occur while she is upright and exerting herself doing household chores. This does not occur in a lying position. Neuro exam is unremarkable today. MRI head in 04/2021 is normal.     We discussed my impression of breath holding likely causative of presyncope. We discussed optimization of fluids, salt intake, compression stockings, crossing legs/tensing muscle prior to position changes. Will obtain echo, CTA head and neck to look for vertebrobasilar insufficiency. We also discuss treatment options for breath holding. I asked that she also speak to her Pulmonologist about these symptoms. Neurologic diagnostic considerations include motor tics and autonomic dysfunction. Will order autonomic testing and tilt table testing to investigate further. Consider trial of guanfacine or tetrabenazine.     HPI:   Spells + headache   Onset: 2022, insidious. She will hold her breath for 5 seconds or so (involuntary)     Location: bitemporal   Quality: pressure-like   Severity: 3-7/10  Total headache days per month: 15   Duration: 5 seconds   Associated symptoms: blurred vision, tunnel vision    Denies: lacrimation/rhinorrhea, positional changes, ringing in ears/roaring sounds   Aura: no   Triggers: occurs exclusively in the upright position, while doing chores around the house   Sleep/snoring: muscle tension, neck pan, grind her teeth, takes methocarbamol to help her sleep  Hx of head injury: no   Family hx of migraine: no  Last Eye exam: annual   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     She reports good oral intake. She does not add salt to her food. She denies LOC.     Relevant PMHx:  1. Anxiety/depression   2. Chronic headaches   3. Dizziness   4. Menopause on HRT   5. Chronic neck and muscle tension   6. Seasonal allergies   7. Chronic cough related silent reflux     Prior Workup:   1. [facility] MRI Head 05/04/21: normal  2. [facility] Tilt table 10/2021: + of dysautonomia, sympathoadrenal dysfunction suggested and low cardiovagal function   3.  CTA head and neck 10/2021: no significant stenosis, degenerative spine changes   __________________________________________________________________________________    Medical History:   Diagnosis Date   ? Blurry vision    ? Chronic cough    ? Dizziness    ? Generalized headaches    ?  Gluten intolerance    ? Headache    ? Hypersensitivity pneumonitis (HCC)    ? Hypothyroidism    ? PONV (postoperative nausea and vomiting)    ? Vision decreased      Surgical History:   Procedure Laterality Date   ? LUNG BIOPSY Right 02/03/2009   ? TENOTOMY Left 02/08/2016    arm-elbow   ? ESOPHAGOGASTRODUODENOSCOPY WITH SPECIMEN COLLECTION BY BRUSHING/ WASHING N/A 06/21/2021    Performed by Veneta Penton, MD at [facility] OR   ? ESOPHAGOGASTRODUODENOSCOPY WITH BIOPSY - FLEXIBLE N/A 06/21/2021    Performed by Veneta Penton, MD at [facility] OR   ? HX BACK SURGERY       Social History     Tobacco Use   ? Smoking status: Former     Types: Cigarettes   ? Smokeless tobacco: Never   ? Tobacco comments:     Quit smoking 5yrs ago   Substance Use Topics   ? Alcohol use: Yes     Comment: weekends   ? Drug use: Not Currently     Family History   Problem Relation Age of Onset   ? Diabetes Type II Father      Allergies   Allergen Reactions   ? Gluten DIARRHEA, FLATULENCE, NAUSEA AND VOMITING, SEE COMMENTS, RASH and STOMACH UPSET     Digestive problems and rash     ? Almond UNKNOWN   ? Asa [Aspirin] SEE COMMENTS     Nose bleeds     ? Egg UNKNOWN     Review of Systems  ROS negative unless otherwise specified in HPI    Objective:         ? ADVAIR HFA 230-21 mcg/actuation inhaler    ? ALPRAZolam (XANAX) 0.25 mg tablet    ? buPROPion XL (WELLBUTRIN XL) 150 mg tablet Take one tablet by mouth every morning.   ? cyclobenzaprine (FLEXERIL) 10 mg tablet Take one tablet by mouth at bedtime daily.   ? EPINEPHrine (EPIPEN 2-PAK) 1 mg/mL injection pen (2-Pack) Inject 0.3 mg (1 Pen) into thigh if needed for anaphylactic reaction. May repeat in 5-15 minutes if needed.   ? famotidine (PEPCID) 40 mg tablet Take one tablet by mouth daily 30 minutes before dinner.   ? fludrocortisone (FLORINEF) 0.1 mg tablet Take one tablet by mouth daily.   ? levothyroxine (SYNTHROID) 75 mcg tablet Take one tablet by mouth daily 30 minutes before breakfast.   ? methocarbamoL (ROBAXIN) 750 mg tablet    ? midodrine (PROAMATINE) 5 mg tablet Take one tablet by mouth twice daily.   ? montelukast (SINGULAIR) 10 mg tablet    ? omeprazole DR (PRILOSEC) 40 mg capsule TAKE ONE CAPSULE BY MOUTH EVERY DAY before breakfast, TAKE 30 MINUTES before a meal   ? progesterone, micronized (PROMETRIUM) 100 mg capsule    ? spironolactone (ALDACTONE) 100 mg tablet    ? spironolactone (ALDACTONE) 50 mg tablet      Vitals:    01/02/22 1123   BP: 112/77   BP Source: Arm, Left Upper   Pulse: 65   SpO2: 99%   PainSc: Zero   Weight: 81.6 kg (180 lb)   Height: 167.6 cm (5' 6)     Body mass index is 29.05 kg/m?Marland Kitchen     General: alert, oriented x 3  Speech: normal, no dysarthria  Cardiovascular: regular rate and rhythm  Lungs: resting comfortably on RA   Ext: no leg edema  Cranial nerves: II  Visual fields full to finger counting; III, IV, VI PERRL, extraocular muscles intact, no nystagmus; V facial sensation intact; VII facial expression symmetric; VIII hearing intact to conversation; IX, X palate rise symmetric; XI sternocleidomastoid strength intact; XII tongue midline, no fasciculations present  Motor: (Right/Left)  Deltoid 5/5, Biceps 5/5, Triceps 5/5, Finger ext 5/5, interossei 5/5, Hip Flexion 5/5, Knee ext 5/5, Knee flex 5/5, Ankle dorsiflexion 5/5, Ankle plantarflexion 5/5  Sensory: Normal to light touch. Proprioception intact at toes. Pinprick is normal. Vibration in seconds R/L toes  Coordination: normal finger nose finger, rapid alternating movements and finger tapping speed normal, and heel to shin smooth without ataxia  Reflexes: (Right/Left) Biceps 2/2, Triceps 2/2, Brachioradialis 2/2, Knee Jerks 2/2, Ankle Jerks 2/2, Toes downgoing  Abnormal Movements: no tremors, no rigidity, no bradykinesia  Gait: normal based, good arm swing, Toe/heel walking without assistance, Tandem gait intact      ASSESSMENT/PLAN:  CLINICAL SUMMARY      ZARIELLE CEA 51 y.o. female with notable history of hypersensitivity pneumonitis, chronic allergies, menopause on HRT here today for follow up of recurrent spells of dysautonomia on midodrine.     Interval tilt table testing confirmed autonomic dysfunction (sympathoadrenal dysfunction). There is no history of T2DM or neuropathy. She does complain of poor sense of smell and rarely acting out her dreams at night (rare). 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 She does not display any other parkinsonian symptoms at this time. We discussed completing skin biopsy and starting fluorinef for symptomatic management.  She did not tolerate midodrine so we will stop this.     Impression:  1. Autonomic dysfunction     RECOMMENDATIONS  1. Stop midodrine   2. Start fluorinef .1 mg once daily, check CMP in 1 month (sent to [facility])   3. Liberalize salt intake, adequate oral hydration, muscle tensing with position changes   4. Skin biopsy for screening of synuclienopathy     MANAGEMENT AND PLAN  During the visit I discussed my impression, recommended diagnostic studies, prognosis, risks and benefits of management, instructions for management, and importance of compliance.  After a discussion, the patient agrees with the plan.     Total Time Today was 60 minutes in the following activities: Preparing to see the patient, Obtaining and/or reviewing separately obtained history, Performing a medically appropriate examination and/or evaluation, Counseling and educating the patient/family/caregiver and Ordering medications, tests, or procedures    FOLLOWUP PLAN  Return in about 6 months (around 07/04/2022).  The patient is instructed to contact me if there are any concerns with the agreed plan.    Barb Merino, MD  Clinical Assistant Professor   Department of Neurology  [facility]

## 2022-01-02 ENCOUNTER — Encounter: Admit: 2022-01-02 | Discharge: 2022-01-02 | Payer: BC Managed Care – PPO

## 2022-01-02 ENCOUNTER — Ambulatory Visit: Admit: 2022-01-02 | Discharge: 2022-01-03 | Payer: BC Managed Care – PPO

## 2022-01-02 DIAGNOSIS — G3189 Other specified degenerative diseases of nervous system: Secondary | ICD-10-CM

## 2022-01-02 DIAGNOSIS — E039 Hypothyroidism, unspecified: Secondary | ICD-10-CM

## 2022-01-02 DIAGNOSIS — Z5181 Encounter for therapeutic drug level monitoring: Secondary | ICD-10-CM

## 2022-01-02 DIAGNOSIS — R053 Chronic cough: Secondary | ICD-10-CM

## 2022-01-02 DIAGNOSIS — J679 Hypersensitivity pneumonitis due to unspecified organic dust: Secondary | ICD-10-CM

## 2022-01-02 DIAGNOSIS — R42 Dizziness and giddiness: Secondary | ICD-10-CM

## 2022-01-02 DIAGNOSIS — R112 Nausea with vomiting, unspecified: Secondary | ICD-10-CM

## 2022-01-02 DIAGNOSIS — H538 Other visual disturbances: Secondary | ICD-10-CM

## 2022-01-02 DIAGNOSIS — H547 Unspecified visual loss: Secondary | ICD-10-CM

## 2022-01-02 DIAGNOSIS — R519 Generalized headaches: Secondary | ICD-10-CM

## 2022-01-02 DIAGNOSIS — K9041 Non-celiac gluten sensitivity: Secondary | ICD-10-CM

## 2022-01-02 MED ORDER — FLUDROCORTISONE 0.1 MG PO TAB
.1 mg | ORAL_TABLET | Freq: Every day | ORAL | 5 refills | 90.00000 days | Status: AC
Start: 2022-01-02 — End: ?

## 2022-01-03 DIAGNOSIS — G909 Disorder of the autonomic nervous system, unspecified: Secondary | ICD-10-CM

## 2022-01-11 ENCOUNTER — Encounter: Admit: 2022-01-11 | Discharge: 2022-01-11 | Payer: BC Managed Care – PPO

## 2022-01-11 NOTE — Progress Notes
No PA required for CPT codes 20947 and 11105 per [facility] [phone]. Automated system no reference # provided.

## 2022-01-15 ENCOUNTER — Encounter: Admit: 2022-01-15 | Discharge: 2022-01-15 | Payer: BC Managed Care – PPO

## 2022-01-15 ENCOUNTER — Ambulatory Visit: Admit: 2022-01-15 | Discharge: 2022-01-15 | Payer: BC Managed Care – PPO

## 2022-01-15 DIAGNOSIS — R053 Chronic cough: Secondary | ICD-10-CM

## 2022-01-15 DIAGNOSIS — H547 Unspecified visual loss: Secondary | ICD-10-CM

## 2022-01-15 DIAGNOSIS — K9041 Non-celiac gluten sensitivity: Secondary | ICD-10-CM

## 2022-01-15 DIAGNOSIS — H538 Other visual disturbances: Secondary | ICD-10-CM

## 2022-01-15 DIAGNOSIS — R0989 Other specified symptoms and signs involving the circulatory and respiratory systems: Secondary | ICD-10-CM

## 2022-01-15 DIAGNOSIS — F419 Anxiety disorder, unspecified: Secondary | ICD-10-CM

## 2022-01-15 DIAGNOSIS — R112 Nausea with vomiting, unspecified: Secondary | ICD-10-CM

## 2022-01-15 DIAGNOSIS — J302 Other seasonal allergic rhinitis: Secondary | ICD-10-CM

## 2022-01-15 DIAGNOSIS — R42 Dizziness and giddiness: Secondary | ICD-10-CM

## 2022-01-15 DIAGNOSIS — E039 Hypothyroidism, unspecified: Secondary | ICD-10-CM

## 2022-01-15 DIAGNOSIS — R519 Generalized headaches: Secondary | ICD-10-CM

## 2022-01-15 DIAGNOSIS — J679 Hypersensitivity pneumonitis due to unspecified organic dust: Secondary | ICD-10-CM

## 2022-01-15 NOTE — Progress Notes
Date of Service: 01/15/2022    Subjective:             Danielle Garner is a 51 y.o. female.    History of Present Illness    Follow up globus and chronic cough.  Interval EGD normal.  She remains cough free and has identified specific food triggers including dairy more recently         Review of Systems   Constitutional: Negative.    HENT: Negative.    Eyes: Negative.    Respiratory: Negative.    Cardiovascular: Negative.    Gastrointestinal: Negative.    Endocrine: Negative.    Genitourinary: Negative.    Musculoskeletal: Negative.    Skin: Negative.    Allergic/Immunologic: Negative.    Neurological: Negative.    Hematological: Negative.    Psychiatric/Behavioral: Negative.          Objective:         ? ADVAIR HFA 230-21 mcg/actuation inhaler    ? ALPRAZolam (XANAX) 0.25 mg tablet    ? EPINEPHrine (EPIPEN 2-PAK) 1 mg/mL injection pen (2-Pack) Inject 0.3 mg (1 Pen) into thigh if needed for anaphylactic reaction. May repeat in 5-15 minutes if needed.   ? famotidine (PEPCID) 40 mg tablet Take one tablet by mouth daily 30 minutes before dinner.   ? fludrocortisone (FLORINEF) 0.1 mg tablet Take one tablet by mouth daily.   ? levothyroxine (SYNTHROID) 75 mcg tablet Take one tablet by mouth daily 30 minutes before breakfast.   ? methocarbamoL (ROBAXIN) 750 mg tablet    ? midodrine (PROAMATINE) 2.5 mg tablet Take one tablet by mouth daily.   ? montelukast (SINGULAIR) 10 mg tablet    ? omeprazole DR (PRILOSEC) 40 mg capsule TAKE ONE CAPSULE BY MOUTH EVERY DAY before breakfast, TAKE 30 MINUTES before a meal   ? progesterone, micronized (PROMETRIUM) 100 mg capsule      Vitals:    01/15/22 1309   BP: 108/73   Pulse: 72   Temp: 36.6 ?C (97.8 ?F)   PainSc: Zero   Weight: 86.2 kg (190 lb)   Height: 167.6 cm (5' 6)     Body mass index is 30.67 kg/m?Marland Kitchen     Physical Exam    General   Well-developed, well-nourished   Communication and Voice:  Clear pitch and clarity, age appropriate   Head and Face   Inspection:  Normocephalic and atraumatic without masses or lesions  Eyes   Nystagmus: None   EOM: Equal extraocular motion bilaterally  ENT   External nose:  No scar or anatomic deformity   Internal Nose:  Septum intact and midline.  No edema, polyps, or rhinorrhea   Lips, Teeth, and gums:  Mucosa and teeth intact and viable   Oral cavity/oropharynx:  No erythema or exudate, no nodules, lesions or masses noted  Ear   External canal:  Left - Canal is patent with intact skin                             Right - Canal is patent with intact skin   Tympanic Membranes:  Left - Clear and mobile                                             Right - Clear and mobile   Middle Ears:  Left - appears aerated, no effusion, no masses                           Right - appears aerated, no effusion, no masses  Respiratory   Respiratory effort:  Equal inspiration & expiration without use of accessory muscles. No  stridor  Neuro/Psych/Balance   Orientation: Patient oriented to person, place, and time   Affect: Appropriate mood and affect         Assessment and Plan:    Follow up globus and chronic cough.  Interval EGD normal.  She remains cough free and has identified specific food triggers including dairy more recently.    Recommend weaning off reflux meds gradually.  If symptoms recur she should resume medications and may need long term management with her PCP.  Follow up in three months.

## 2022-01-15 NOTE — Patient Instructions
Recommended starting a reflux medication weaning program:    1.  Once you run out of your current prescription of Omeprazole 40mg (pilosec) start taking omeprazole 20mg (over the counter dose) every morning for 4 weeks.  Continue taking your Pepcid (famotidine) 40mg nightly.  Continue dietary restrictions throughout.    2.  Start taking omeprazole 20mg (over the counter dose) every other morning for 4 weeks.  Continue taking your Pepcid (famotidine) 40mg nightly. Continue dietary restrictions throughout.      3.  Stop your omeprazole.  Continue taking your Pepcid (famotidine) 40mg nightly for two weeks.  Continue dietary restrictions throughout.      4. Continue taking your Pepcid (famotidine) 40mg, but change to every other night for 2 weeks.  Continue dietary restrictions throughout.      5.  Stop regular use of Pepcid (famotidine) and use only as needed.   Continue dietary restrictions throughout.

## 2022-01-29 ENCOUNTER — Encounter: Admit: 2022-01-29 | Discharge: 2022-01-29 | Payer: BC Managed Care – PPO

## 2022-02-06 ENCOUNTER — Encounter: Admit: 2022-02-06 | Discharge: 2022-02-06 | Payer: BC Managed Care – PPO

## 2022-02-07 ENCOUNTER — Encounter: Admit: 2022-02-07 | Discharge: 2022-02-07 | Payer: BC Managed Care – PPO

## 2022-02-07 ENCOUNTER — Ambulatory Visit: Admit: 2022-02-07 | Discharge: 2022-02-07 | Payer: BC Managed Care – PPO

## 2022-02-07 DIAGNOSIS — R053 Chronic cough: Secondary | ICD-10-CM

## 2022-02-07 DIAGNOSIS — H547 Unspecified visual loss: Secondary | ICD-10-CM

## 2022-02-07 DIAGNOSIS — J679 Hypersensitivity pneumonitis due to unspecified organic dust: Secondary | ICD-10-CM

## 2022-02-07 DIAGNOSIS — R55 Syncope and collapse: Secondary | ICD-10-CM

## 2022-02-07 DIAGNOSIS — R112 Nausea with vomiting, unspecified: Secondary | ICD-10-CM

## 2022-02-07 DIAGNOSIS — G909 Disorder of the autonomic nervous system, unspecified: Secondary | ICD-10-CM

## 2022-02-07 DIAGNOSIS — E039 Hypothyroidism, unspecified: Secondary | ICD-10-CM

## 2022-02-07 DIAGNOSIS — R42 Dizziness and giddiness: Secondary | ICD-10-CM

## 2022-02-07 DIAGNOSIS — F419 Anxiety disorder, unspecified: Secondary | ICD-10-CM

## 2022-02-07 DIAGNOSIS — H538 Other visual disturbances: Secondary | ICD-10-CM

## 2022-02-07 DIAGNOSIS — K9041 Non-celiac gluten sensitivity: Secondary | ICD-10-CM

## 2022-02-07 DIAGNOSIS — R519 Headache: Secondary | ICD-10-CM

## 2022-02-07 DIAGNOSIS — Z136 Encounter for screening for cardiovascular disorders: Secondary | ICD-10-CM

## 2022-02-07 DIAGNOSIS — J302 Other seasonal allergic rhinitis: Secondary | ICD-10-CM

## 2022-02-07 MED ORDER — MIDODRINE 2.5 MG PO TAB
2.5 mg | ORAL_TABLET | Freq: Three times a day (TID) | ORAL | 1 refills | Status: AC | PRN
Start: 2022-02-07 — End: ?

## 2022-02-07 NOTE — Progress Notes
Date of Service: 02/07/2022    Danielle Garner is a 51 y.o. female.       HPI     This is a delightfully pleasant 51 year old female whom is seen at the Department of cardiovascular medicine clinic at the [facility] today for initial cardiovascular consultation.  Her husband, Luisa Hart, is also a patient of mine.  She is here today to discuss the recently abnormal tilt table test performed in June 2023.  Her past medical history includes seasonal allergies, gastroesophageal reflux, and hypothyroidism.  She has remote history of tobacco use approximately 20 years ago.  There is minimal social alcohol use.    She tells me that a few years ago she was started on some hormone replacement therapy.  She specifically mentions of spironolactone to prevent hair loss as well.  Since that time she had some trouble focusing her vision, lightheadedness, and dizziness.  She never had frank syncope.  She has been seen by neurology as well.  She was taken off of spironolactone and her symptoms essentially resolved.  They went ahead with some cardiovascular testing as detailed below.  She was started on midodrine.  She notes that she feels worse on the high-dose midodrine.  She actually feels no different on the low-dose of midodrine, but again really has not had any symptoms since stopping spironolactone.  She has been having some intentional weight loss over the past few months.  She has 2 daughters who will be getting married over the next 8 months.  1 is getting married in December and the other in May.    Pertinent cardiovascular testing:  ? Twelve-lead ECG in our office today shows normal sinus rhythm at a rate of 70 bpm.  There is a normal axis and normal intervals.  There is borderline low voltage.  ? Autonomic function testing with tilt was performed in June 2023.  There was significant dysautonomia with some pathogenic decreased function with a delayed response.  After about 6 minutes the patient's blood pressure declined by 30 mmHg with no change in heart rate.  She was symptomatic but did not lose consciousness.  The cardiovagal function was low normal.  The blood pressure response to Valsalva pattern was within normal limits.  ? Resting echo Doppler study was performed in May 2023.  LV cavity size was upper limits of normal.  LV systolic function was within normal limits with an EF of 55 to 60%.  There were no regional wall motion abnormalities.  There were no valvular abnormalities.  Estimated PASP was normal at 22 mmHg.       Vitals:    02/07/22 1511   BP: 122/72   BP Source: Arm, Left Upper   Pulse: 73   SpO2: 100%   O2 Device: None (Room air)   PainSc: Zero   Weight: 83 kg (183 lb)   Height: 167.6 cm (5' 6)     Body mass index is 29.54 kg/m?Marland Kitchen     Past Medical History  Patient Active Problem List    Diagnosis Date Noted   ? 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  Autonomic function testing results are abnormal.  Significant dysautonomia.  Sympathoadrenergic function is decreased based on the tilt table test with a delayed response  Cardiovagal function is low normal  However blood pressure response to Valsalva pattern seems to be present.     ? Pre-syncope 01/29/2022     09/26/2021 Echo:  1. Normal left ventricular cavitary dimensions with normal geometry   2. Normal left ventricular systolic function with a visually estimated ejection fraction of ~55-60 %.  3. Normal right ventricular cavitary size and systolic function.  4. Normal left ventricular diastolic function.  5. No left ventricular focal regional wall motion abnormality.  6. No chamber enlargement.  7. No hemodynamically significant valvular stenosis or regurgitation.  8. No pericardial effusion.  9. Estimated PA systolic pressure is 22 mmHg.       ? 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 Food allergy 01/29/2021     Patient had food allergy testing in 2019 in [place]. Since then she has been avoiding gluten, wheat, yeast, almonds, peanut, and tree nuts. Testing initially done due to some vague GI symptoms of indigestion and she feels better with these foods cut out. She also sometimes has bloating with egg whites and would like to be tested for this as she is interested in the possibility of reintroducing certain foods if she can.    SPT to  Wheat: Negative  Almond: Negative  Peanut: Negative  Pecan: Negative  Pistachio: Negative  Egg White: Positive    Plan:  - IgE to the foods tested above  - If IgE testing is also negative, will discuss scheduling an oral challenge to any food she prefers in the office   - If she is interested in pursuing testing to other tree nuts not done today she can let us know      ? Non-seasonal allergic rhinitis 01/29/2021     She has a history of nasal congestion and rhinorrhea that has been progressively worsening since 2008. Previously had aeroallergen IgE testing at an allergy clinic in [place] that was positive to a number of aeroallergens. She tried AIT but did not tolerate it due to fatigue, muscle aches. Currently taking Singulair 10mg  daily which she finds beneficial. She has tried multiple intranasal corticosteroids but did not tolerate these.     Today she would like to know her aeroallergen pattern and discuss ways to optimize symptom control.    Aeroallergen SPT 01/29/21: Positive to 4 trees, 2 grasses, 3 weeds, dust mite, cockroach, feather, 3 molds    Plan:  - Continue Singulair 10mg  daily   - Discussed aeroallergen avoidance techniques  - Start Astelin nasal spray 1-2 SPEN 1-2x/day, she will let us know if she is not able to tolerate this     ? Cough 01/29/2021     States she has worsening episodes of cough over the last few years. In 2009 she saw a pulmonologist and was diagnosed with hypersensitivity pneumonitis but has not since followed up. No previous hx of asthma. States currently she thinks her cough is caused by PND. Has used Advair and albuterol intermittently which were helpful. Spirometry in office today was normal. The etiology of her cough could be multifactorial, with a component of post nasal drip as a trigger. Spirometry not consistent with asthma at this time. She does have a history of pneumonitis so she does need to follow up with pulmonology to monitor this.     Spirometry 01/29/21: Normal     Plan:  - Discussed importance of following up with Pulmonology given her history of pneumonitis  - Continue current management as being managed by her PCP            Review of Systems   All other systems reviewed and are negative.      Physical Exam  Nursing note and vitals reviewed.  Constitutional: She appears well-developed. No distress.   HENT:   Head: Normocephalic and atraumatic.   Nose: Nose normal.   Eyes: Pupils are equal, round, and reactive to light. Conjunctivae are normal. No scleral icterus.   Neck: No JVD present. Carotid bruit is not present (bilaterally).   Cardiovascular: Normal rate, regular rhythm and normal heart sounds. Exam reveals no gallop and no friction rub.   No murmur heard.  Pulmonary/Chest: Effort normal and breath sounds normal. No respiratory distress. She has no wheezes. She has no rales.   Abdominal: Soft. Bowel sounds are normal. She exhibits no distension. There is no abdominal tenderness. There is no guarding.   Musculoskeletal:         General: No tenderness. Normal range of motion.      Cervical back: Normal range of motion.   Lymphadenopathy:     She has no cervical adenopathy.   Neurological: She is alert and oriented to person, place, and time. No cranial nerve deficit. Coordination normal.   Skin: Skin is warm and dry. No rash noted. She is not diaphoretic. No erythema.   Psychiatric: Her behavior is normal.         Cardiovascular Studies      Cardiovascular Health Factors  Vitals BP Readings from Last 3 Encounters:   02/07/22 122/72   01/15/22 108/73   01/02/22 112/77     Wt Readings from Last 3 Encounters:   02/07/22 83 kg (183 lb) 01/15/22 86.2 kg (190 lb)   01/02/22 81.6 kg (180 lb)     BMI Readings from Last 3 Encounters:   02/07/22 29.54 kg/m?   01/15/22 30.67 kg/m?   01/02/22 29.05 kg/m?      Smoking Social History     Tobacco Use   Smoking Status Former   ? Types: Cigarettes   ? Quit date: 09/03/1992   ? Years since quitting: 29.4   Smokeless Tobacco Never   Tobacco Comments    Quit smoking 61yrs ago      Lipid Profile No results found for: CHOL  No results found for: HDL  No results found for: LDL  No results found for: TRIG   Blood Sugar No results found for: HGBA1C  Glucose   Date Value Ref Range Status   01/25/2022 86 70 - 105 mg/dL Final          Problems Addressed Today  Encounter Diagnoses   Name Primary?   ? Screening for heart disease Yes   ? Autonomic dysfunction    ? Pre-syncope        Assessment and Plan     1. 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 Etiology unclear, but suspect the clinical course will overall be benign  ? She really has not had any further symptoms since stopping spironolactone  ? We discussed conservative measures such as staying well-hydrated, liberalizing salt in the diet, avoiding rapid changes in position, regular physical activity, avoiding excesses of caffeine and alcohol, periodic leg contractions, etc.  We also discussed the use of compression stockings  ? I do not feel strongly that she needs any further cardiovascular testing at this time.  ? I do think it is okay for her to discontinue her very low-dose midodrine.  This was only being taken once a day and unlikely to be having any significant benefit upon my line of questioning in the office today.  She has not started  the fludrocortisone, and we will hold off at this time.    Patient's questions were answered and they agreed with the above plan.  Specific instructions were typed into their After Visit Summary document.  Follow-up in 12 months or sooner as needed.  Thank you for the opportunity to participate in the care of your patient.  Please call with questions or concerns.    Gloris Ham, MD, [facility]  Department of Cardiovascular Medicine  [facility]       Total time spent on today's office visit was 45 minutes. This includes face-to-face in person visit with patient as well as nonface-to-face time including review of the EMR, outside records, labs, radiologic studies, echocardiogram & other cardiovascular studies, formulation of treatment plan, after visit summary, future disposition, and lastly on documentation.    Current Medications (including today's revisions)  ? ADVAIR HFA 230-21 mcg/actuation inhaler    ? ALPRAZolam (XANAX) 0.25 mg tablet    ? EPINEPHrine (EPIPEN 2-PAK) 1 mg/mL injection pen (2-Pack) Inject 0.3 mg (1 Pen) into thigh if needed for anaphylactic reaction. May repeat in 5-15 minutes if needed.   ? famotidine (PEPCID) 40 mg tablet Take one tablet by mouth daily 30 minutes before dinner.   ? levothyroxine (SYNTHROID) 75 mcg tablet Take one tablet by mouth daily 30 minutes before breakfast.   ? methocarbamoL (ROBAXIN) 750 mg tablet    ? midodrine (PROAMATINE) 2.5 mg tablet Take one tablet by mouth three times daily as needed.   ? montelukast (SINGULAIR) 10 mg tablet    ? omeprazole DR (PRILOSEC) 40 mg capsule TAKE ONE CAPSULE BY MOUTH EVERY DAY before breakfast, TAKE 30 MINUTES before a meal   ? progesterone, micronized (PROMETRIUM) 100 mg capsule

## 2022-02-11 ENCOUNTER — Encounter: Admit: 2022-02-11 | Discharge: 2022-02-11 | Payer: BC Managed Care – PPO

## 2022-02-27 ENCOUNTER — Encounter: Admit: 2022-02-27 | Discharge: 2022-02-27 | Payer: BC Managed Care – PPO

## 2022-02-27 NOTE — Telephone Encounter
[facility] Biopsy Nurse Intake Form  Patient was called and biopsy discussed in detail.   Questions answered to the best of my ability.   Pt stated no questions or concerns at this time.      [facility] Date/Time 05/08/21 at 2:30    Ordering doctor Dr. Geanie Cooley    EMG Date      EMG results      Prior Auth received         Allergies  NO Yes Reaction    Rubbing Alcohol X       Iodine/ Shellfish X       Lidocaine X       Epinephrine X       Tape (adhesive sensitivity) X       Latex X       Other:             Anticoag/platelet/ blood thinners:               None Immuno   Suppressants  None   Aspirin   Azathioprine (Imuran)     Aggrenox   Copaxone     Coumadin ( Warfarin )   Cyclosporine     Fish Oil   Cytoxine     CoQ 10   Gengraf     Ibuprofen   Prograf     Plavix   Glatiramer Acetate     Other :   Methotrexate         Mycophenolate ( cellcept)         Neoral         Rapamune         Meds ending in Umab or domide                            ? 2 specimens will be obtained. One will be taken a few inches above your ankle and one a few inches below your hip. Each specimen is 3mm in diameter.  ? Dr. Meredeth Ide will start by cleansing the sites with topical iodine and rubbing alcohol. Each site is then numbed using the lidocaine/epinephrine.   ? After the local anesthetic agents take effect, Dr. Meredeth Ide will remove each specimen using a sterile 3mm punch biopsy tool. After both specimens are obtained; Dr. Meredeth Ide will cover each site with antibiotic ointment and a bandage.  ? The specimens will be submitted to processing. Results take a few weeks for processing. You will be notified of the results by your referring physician when they are complete.   ? Additional appointment instructions:  ? Please remember to eat meals as it is not necessary to fast for this procedure  ? It is not necessary to have a driver   ? If you begin any new medications prior to your scheduled appt date, please contact our office with this information as some medications may not be safe to take before this procedure; if you are currently taking medications that thin your blood or suppress your immune system please contact our office to discuss this. We may not be able to move forward with this procedure.   ? Please arrive 2-5 minutes prior to your scheduled appointment time  ? Face coverings are mandatory at this time     ? Please bring the following items to your biopsy appointment;  ? Valid Photo ID  ? Insurance Card  ? Current List of Medications (Including over the counter medications)  ?

## 2022-04-04 ENCOUNTER — Encounter: Admit: 2022-04-04 | Discharge: 2022-04-04 | Payer: BC Managed Care – PPO

## 2022-04-04 MED ORDER — FAMOTIDINE 40 MG PO TAB
ORAL_TABLET | ORAL | 0 refills | 90.00000 days | Status: AC
Start: 2022-04-04 — End: ?

## 2022-04-25 ENCOUNTER — Encounter: Admit: 2022-04-25 | Discharge: 2022-04-25 | Payer: BC Managed Care – PPO

## 2022-05-08 ENCOUNTER — Encounter: Admit: 2022-05-08 | Discharge: 2022-05-08 | Payer: BC Managed Care – PPO

## 2022-05-08 ENCOUNTER — Ambulatory Visit: Admit: 2022-05-08 | Discharge: 2022-05-08 | Payer: BC Managed Care – PPO

## 2022-05-08 MED ORDER — NEOMYCIN-BACITRACIN-POLYMYXIN TP PACKET GROUP
Freq: Once | TOPICAL | 0 refills
Start: 2022-05-08 — End: ?

## 2022-05-08 MED ORDER — LIDOCAINE-EPINEPHRINE 1 %-1:100,000 IJ SOLN
2 mL | Freq: Once | INTRAMUSCULAR | 0 refills
Start: 2022-05-08 — End: ?

## 2022-05-14 ENCOUNTER — Encounter: Admit: 2022-05-14 | Discharge: 2022-05-14 | Payer: BC Managed Care – PPO

## 2022-05-17 ENCOUNTER — Encounter: Admit: 2022-05-17 | Discharge: 2022-05-17 | Payer: BC Managed Care – PPO

## 2022-06-18 ENCOUNTER — Encounter: Admit: 2022-06-18 | Discharge: 2022-06-18 | Payer: BC Managed Care – PPO

## 2022-07-03 NOTE — Progress Notes
Subjective:       History of Present Illness  Danielle Garner is a 52 y.o. female presenting to Neurology clinic for follow up of autonomic dysfunction.     In the interim, skin biopsy was attempted to be ordered but unable to be completed.     She reports improvement in her dizziness episodes. She denies experiencing these as of recent. She has been working on lifestyle changes to improve her breath holding spells as well.     Her husband reports not seeing any nocturnal episodes recently.     We reviewed her workup to date.     We discussed she has a daughter with concerns for ehlers danlos.     Clinical summary at last visit 01/02/22:   Interval tilt table testing confirmed autonomic dysfunction (sympathoadrenal dysfunction). There is no history of T2DM or neuropathy. She does complain of poor sense of smell and rarely acting out her dreams at night (rare). 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 She does not display any other parkinsonian symptoms at this time. We discussed completing skin biopsy and starting fluorinef for symptomatic management. She did not tolerate midodrine so we will stop this.     Clinical summary at last visit 08/15/21:   Spells began insidiously about 1 year ago. These occur about 15x per month and consist of involuntarily holding her breath for 4-5 seconds followed by a mild-moderate bitemporal headache, vision blurring + tunnel vision and dizziness lasting 5-10 seconds without interruption in consciousness and immediately returns to baseline. This tends to occur while she is upright and exerting herself doing household chores. This does not occur in a lying position. Neuro exam is unremarkable today. MRI head in 04/2021 is normal.     We discussed my impression of breath holding likely causative of presyncope. We discussed optimization of fluids, salt intake, compression stockings, crossing legs/tensing muscle prior to position changes. Will obtain echo, CTA head and neck to look for vertebrobasilar insufficiency. We also discuss treatment options for breath holding. I asked that she also speak to her Pulmonologist about these symptoms. Neurologic diagnostic considerations include motor tics and autonomic dysfunction. Will order autonomic testing and tilt table testing to investigate further. Consider trial of guanfacine or tetrabenazine.     HPI:   Spells + headache   Onset: 2022, insidious. She will hold her breath for 5 seconds or so (involuntary)     Location: bitemporal   Quality: pressure-like   Severity: 3-7/10  Total headache days per month: 15   Duration: 5 seconds   Associated symptoms: blurred vision, tunnel vision    Denies: lacrimation/rhinorrhea, positional changes, ringing in ears/roaring sounds   Aura: no   Triggers: occurs exclusively in the upright position, while doing chores around the house   Sleep/snoring: muscle tension, neck pan, grind her teeth, takes methocarbamol to help her sleep  Hx of head injury: no   Family hx of migraine: no  Last Eye exam: annual   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     She reports good oral intake. She does not add salt to her food. She denies LOC.     Relevant PMHx:  Anxiety/depression   Chronic headaches   Dizziness   Menopause on HRT   Chronic neck and muscle tension   Seasonal allergies   Chronic cough related silent reflux     Prior Workup:  [facility] MRI Head 05/04/21: normal  [facility] Tilt table 10/2021: + of dysautonomia, sympathoadrenal dysfunction suggested and low cardiovagal function   [facility] CTA head and neck 10/2021: no significant stenosis, degenerative spine changes   __________________________________________________________________________________    Medical History:   Diagnosis Date    Anxiety disorder     Blurry vision     Chronic cough     Dizziness     Generalized headaches     Gluten intolerance     Headache     Hypersensitivity pneumonitis (HCC) Hypothyroidism     PONV (postoperative nausea and vomiting)     Seasonal allergic reaction     Vision decreased      Surgical History:   Procedure Laterality Date    LUNG BIOPSY Right 02/03/2009    TENOTOMY Left 02/08/2016    arm-elbow    ESOPHAGOGASTRODUODENOSCOPY WITH SPECIMEN COLLECTION BY BRUSHING/ WASHING N/A 06/21/2021    Performed by Veneta Penton, MD at [facility] OR    ESOPHAGOGASTRODUODENOSCOPY WITH BIOPSY - FLEXIBLE N/A 06/21/2021    Performed by Veneta Penton, MD at [facility] OR    HX BACK SURGERY       Social History     Tobacco Use    Smoking status: Former     Current packs/day: 0.00     Types: Cigarettes     Quit date: 09/03/1992     Years since quitting: 29.8    Smokeless tobacco: Never    Tobacco comments:     Quit smoking 63yrs ago   Substance Use Topics    Alcohol use: Yes     Alcohol/week: 10.0 standard drinks of alcohol     Types: 10 Drinks containing 0.5 oz of alcohol per week     Comment: weekends only    Drug use: Never     Family History   Problem Relation Age of Onset    Diabetes Type II Father     Diabetes Father         Diagnosed 2022    Hearing Loss Father     Thyroid Disease Mother      Allergies   Allergen Reactions    Gluten DIARRHEA, FLATULENCE, NAUSEA AND VOMITING, SEE COMMENTS, RASH and STOMACH UPSET     Digestive problems and rash      Almond UNKNOWN    Asa [Aspirin] SEE COMMENTS     Nose bleeds      Egg UNKNOWN     Review of Systems  ROS negative unless otherwise specified in HPI    Objective:          ADVAIR HFA 230-21 mcg/actuation inhaler     ALPRAZolam (XANAX) 0.25 mg tablet     EPINEPHrine (EPIPEN 2-PAK) 1 mg/mL injection pen (2-Pack) Inject 0.3 mg (1 Pen) into thigh if needed for anaphylactic reaction. May repeat in 5-15 minutes if needed.    famotidine (PEPCID) 40 mg tablet TAKE ONE TABLET BY MOUTH EVERY DAY 30 MINUTES before dinner    levothyroxine (SYNTHROID) 75 mcg tablet Take one tablet by mouth daily 30 minutes before breakfast.    methocarbamoL (ROBAXIN) 750 mg tablet montelukast (SINGULAIR) 10 mg tablet     omeprazole DR (PRILOSEC) 40 mg capsule TAKE ONE CAPSULE BY MOUTH EVERY DAY before breakfast, TAKE 30 MINUTES before a meal    progesterone, micronized (PROMETRIUM) 100 mg capsule      Vitals:    07/05/22 1116   BP: 115/76   BP Source: Arm, Left  Upper   Pulse: 62   SpO2: 98%   PainSc: Zero   Height: 167.6 cm (5' 6)       Body mass index is 29.54 kg/m?Marland Kitchen     General: alert, oriented x 3  Speech: normal, no dysarthria  Cardiovascular: regular rate and rhythm  Lungs: resting comfortably on RA   Ext: no leg edema  Cranial nerves: II Visual fields full to finger counting; III, IV, VI PERRL, extraocular muscles intact, no nystagmus; V facial sensation intact; VII facial expression symmetric; VIII hearing intact to conversation; IX, X palate rise symmetric; XI sternocleidomastoid strength intact; XII tongue midline, no fasciculations present  Motor: (Right/Left)  Deltoid 5/5, Biceps 5/5, Triceps 5/5, Finger ext 5/5, interossei 5/5, Hip Flexion 5/5, Knee ext 5/5, Knee flex 5/5, Ankle dorsiflexion 5/5, Ankle plantarflexion 5/5  Sensory: Normal to light touch. Proprioception intact at toes. Pinprick is normal. Vibration in seconds R/L toes  Coordination: normal finger nose finger, rapid alternating movements and finger tapping speed normal, and heel to shin smooth without ataxia  Reflexes: (Right/Left) Biceps 2/2, Triceps 2/2, Brachioradialis 2/2, Knee Jerks 2/2, Ankle Jerks 2/2, Toes downgoing  Abnormal Movements: no tremors, no rigidity, no bradykinesia  Gait: normal based, good arm swing, Toe/heel walking without assistance, Tandem gait intact    ASSESSMENT/PLAN:  CLINICAL SUMMARY      Danielle Garner 52 y.o. female with notable history of hypersensitivity pneumonitis, chronic allergies, menopause on HRT here today for follow up of recurrent spells of dysautonomia.     Since the cessation of spironolactone and implementing lifestyle changes, she reports no issues with dizziness episodes. We discussed the cause of this remains undetermined at this time. She denies heat intolerance, abnormal sweating, tremor, gait issues, muscle stiffness, mood changes, palpitations, fatigue, poor sense of smell, neuropathy, GI upset, dry eyes or eye mouth, or vision changes. She does have longstanding issues with constipation but she feels this is manageable at this time. We agreed to proceed with clinical monitoring for the time being.     Impression:  1. Autonomic dysfunction     RECOMMENDATIONS  1. Clinical monitoring     MANAGEMENT AND PLAN  During the visit I discussed my impression, recommended diagnostic studies, prognosis, risks and benefits of management, instructions for management, and importance of compliance.  After a discussion, the patient agrees with the plan.     Total Time Today was 30 minutes in the following activities: Preparing to see the patient, Obtaining and/or reviewing separately obtained history, Performing a medically appropriate examination and/or evaluation, Counseling and educating the patient/family/caregiver and Ordering medications, tests, or procedures    FOLLOWUP PLAN  Return in about 6 months (around 01/05/2023).  The patient is instructed to contact me if there are any concerns with the agreed plan.    Barb Merino, MD  Clinical Assistant Professor   Department of Neurology  [facility]

## 2022-07-05 ENCOUNTER — Ambulatory Visit: Admit: 2022-07-05 | Discharge: 2022-07-06 | Payer: BC Managed Care – PPO

## 2022-07-05 ENCOUNTER — Encounter: Admit: 2022-07-05 | Discharge: 2022-07-05 | Payer: BC Managed Care – PPO

## 2022-07-05 DIAGNOSIS — H538 Other visual disturbances: Secondary | ICD-10-CM

## 2022-07-05 DIAGNOSIS — K9041 Non-celiac gluten sensitivity: Secondary | ICD-10-CM

## 2022-07-05 DIAGNOSIS — H547 Unspecified visual loss: Secondary | ICD-10-CM

## 2022-07-05 DIAGNOSIS — R053 Chronic cough: Secondary | ICD-10-CM

## 2022-07-05 DIAGNOSIS — R519 Generalized headaches: Secondary | ICD-10-CM

## 2022-07-05 DIAGNOSIS — F419 Anxiety disorder, unspecified: Secondary | ICD-10-CM

## 2022-07-05 DIAGNOSIS — G909 Disorder of the autonomic nervous system, unspecified: Secondary | ICD-10-CM

## 2022-07-05 DIAGNOSIS — J302 Other seasonal allergic rhinitis: Secondary | ICD-10-CM

## 2022-07-05 DIAGNOSIS — R112 Nausea with vomiting, unspecified: Secondary | ICD-10-CM

## 2022-07-05 DIAGNOSIS — E039 Hypothyroidism, unspecified: Secondary | ICD-10-CM

## 2022-07-05 DIAGNOSIS — R42 Dizziness and giddiness: Secondary | ICD-10-CM

## 2022-07-05 DIAGNOSIS — J679 Hypersensitivity pneumonitis due to unspecified organic dust: Secondary | ICD-10-CM

## 2022-07-08 ENCOUNTER — Encounter: Admit: 2022-07-08 | Discharge: 2022-07-08 | Payer: BC Managed Care – PPO

## 2022-07-08 MED ORDER — FAMOTIDINE 40 MG PO TAB
ORAL_TABLET | 0 refills
Start: 2022-07-08 — End: ?

## 2022-08-14 ENCOUNTER — Encounter: Admit: 2022-08-14 | Discharge: 2022-08-14 | Payer: BC Managed Care – PPO

## 2023-02-22 ENCOUNTER — Encounter: Admit: 2023-02-22 | Discharge: 2023-02-22 | Payer: BC Managed Care – PPO

## 2023-02-25 NOTE — Progress Notes
Subjective:       History of Present Illness  Danielle Garner is a 52 y.o. female presenting to Neurology clinic for follow up of autonomic dysfunction.     In the interim, she has noticed worsening muscle tension in her neck and upper back. This is a relatively diffuse in its involvement. She is taking robaxin 750 mg once daily. She has jaw tightness that she has received onabotulinumtoxinA for TMJ that did not help. Cyclobenzaprine in the past did not help.     She is having issues with brain fog and short term memory issues. This is intermittent and not everyday. She has noticed intermittent balance issues in the mornings. She has noticed a poor sense of smell.     She denies significant issues with dizziness.     Clinical summary at last visit 07/05/22:   Since the cessation of spironolactone and implementing lifestyle changes, she reports no issues with dizziness episodes. We discussed the cause of this remains undetermined at this time. She denies heat intolerance, abnormal sweating, tremor, gait issues, muscle stiffness, mood changes, palpitations, fatigue, poor sense of smell, neuropathy, GI upset, dry eyes or eye mouth, or vision changes. She does have longstanding issues with constipation but she feels this is manageable at this time. We agreed to proceed with clinical monitoring for the time being.     Clinical summary at last visit 01/02/22:   Interval tilt table testing confirmed autonomic dysfunction (sympathoadrenal dysfunction). There is no history of T2DM or neuropathy. She does complain of poor sense of smell and rarely acting out her dreams at night (rare). 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 She does not display any other parkinsonian symptoms at this time. We discussed completing skin biopsy and starting fluorinef for symptomatic management. She did not tolerate midodrine so we will stop this.     Clinical summary at last visit 08/15/21:   Spells began insidiously about 1 year ago. These occur about 15x per month and consist of involuntarily holding her breath for 4-5 seconds followed by a mild-moderate bitemporal headache, vision blurring + tunnel vision and dizziness lasting 5-10 seconds without interruption in consciousness and immediately returns to baseline. This tends to occur while she is upright and exerting herself doing household chores. This does not occur in a lying position. Neuro exam is unremarkable today. MRI head in 04/2021 is normal.     We discussed my impression of breath holding likely causative of presyncope. We discussed optimization of fluids, salt intake, compression stockings, crossing legs/tensing muscle prior to position changes. Will obtain echo, CTA head and neck to look for vertebrobasilar insufficiency. We also discuss treatment options for breath holding. I asked that she also speak to her Pulmonologist about these symptoms. Neurologic diagnostic considerations include motor tics and autonomic dysfunction. Will order autonomic testing and tilt table testing to investigate further. Consider trial of guanfacine or tetrabenazine.     HPI:   Spells + headache   Onset: 2022, insidious. She will hold her breath for 5 seconds or so (involuntary)     Location: bitemporal   Quality: pressure-like   Severity: 3-7/10  Total headache days per month: 15   Duration: 5 seconds   Associated symptoms: blurred vision, tunnel vision    Denies: lacrimation/rhinorrhea, positional changes, ringing in ears/roaring sounds   Aura: no   Triggers: occurs exclusively in the upright position, while doing chores around the house   Sleep/snoring: muscle tension, neck pan, grind her teeth, takes methocarbamol  to help her sleep  Hx of head injury: no   Family hx of migraine: no  Last Eye exam: annual   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     She reports good oral intake. She does not add salt to her food. She denies LOC.     Relevant PMHx:  Anxiety/depression   Chronic headaches   Dizziness   Menopause on HRT   Chronic neck and muscle tension   Seasonal allergies   Chronic cough related silent reflux     Prior Workup:   [facility] MRI Head 05/04/21: normal  [place] Tilt table 10/2021: + of dysautonomia, sympathoadrenal dysfunction suggested and low cardiovagal function   [place] CTA head and neck 10/2021: no significant stenosis, degenerative spine changes   __________________________________________________________________________________    Past Medical History:   Diagnosis Date    Anxiety disorder     Blurry vision     Chronic cough     Dizziness     Generalized headaches     Gluten intolerance     Headache     Hypersensitivity pneumonitis (HCC)     Hypothyroidism     Other dysphagia     PONV (postoperative nausea and vomiting)     Seasonal allergic reaction     Vision decreased      Surgical History:   Procedure Laterality Date    LUNG BIOPSY Right 02/03/2009    TENOTOMY Left 02/08/2016    arm-elbow    ESOPHAGOGASTRODUODENOSCOPY WITH SPECIMEN COLLECTION BY BRUSHING/ WASHING N/A 06/21/2021    Performed by Veneta Penton, MD at [facility] OR    ESOPHAGOGASTRODUODENOSCOPY WITH BIOPSY - FLEXIBLE N/A 06/21/2021    Performed by Veneta Penton, MD at [facility] OR    HX BACK SURGERY       Social History     Tobacco Use    Smoking status: Former     Current packs/day: 0.00     Types: Cigarettes     Quit date: 09/03/1992     Years since quitting: 30.5    Smokeless tobacco: Never    Tobacco comments:     Quit smoking 78yrs ago   Substance Use Topics    Alcohol use: Yes     Alcohol/week: 10.0 standard drinks of alcohol     Types: 10 Drinks containing 0.5 oz of alcohol per week     Comment: weekends only    Drug use: Never     Family History   Problem Relation Name Age of Onset    Diabetes Type II Father Rocky Link     Diabetes Father Rocky Link         Diagnosed 2022    Hearing Loss Father Rocky Link     Thyroid Disease Mother Rene Kocher      Allergies   Allergen Reactions Gluten DIARRHEA, FLATULENCE, NAUSEA AND VOMITING, SEE COMMENTS, RASH and STOMACH UPSET     Digestive problems and rash      Almond UNKNOWN    Asa [Aspirin] SEE COMMENTS     Nose bleeds      Egg UNKNOWN     Review of Systems  ROS negative unless otherwise specified in HPI    Objective:          ADVAIR HFA 230-21 mcg/actuation inhaler     ALPRAZolam (XANAX) 0.25 mg tablet     baclofen 5 mg tablet Take  one tablet by mouth three times daily.    EPINEPHrine (EPIPEN 2-PAK) 1 mg/mL injection pen (2-Pack) Inject 0.3 mg (1 Pen) into thigh if needed for anaphylactic reaction. May repeat in 5-15 minutes if needed.    ezetimibe (ZETIA) 10 mg tablet Take one tablet by mouth daily.    famotidine (PEPCID) 40 mg tablet TAKE ONE TABLET BY MOUTH EVERY DAY 30 MINUTES before dinner    levothyroxine (SYNTHROID) 75 mcg tablet Take one tablet by mouth daily 30 minutes before breakfast.    montelukast (SINGULAIR) 10 mg tablet     omeprazole DR (PRILOSEC) 40 mg capsule TAKE ONE CAPSULE BY MOUTH EVERY DAY before breakfast, TAKE 30 MINUTES before a meal    progesterone, micronized (PROMETRIUM) 100 mg capsule      Vitals:    02/26/23 1058   BP: 106/71   BP Source: Arm, Left Upper   Pulse: 68   SpO2: 98%   PainSc: Four   Height: 167.6 cm (5' 6)     Body mass index is 29.54 kg/m?Marland Kitchen     General: alert, oriented x 3  Speech: normal, no dysarthria  Cardiovascular: regular rate and rhythm  Lungs: resting comfortably on RA   Ext: no leg edema  Cranial nerves: II Visual fields full to finger counting; III, IV, VI PERRL, extraocular muscles intact, no nystagmus; V facial sensation intact; VII facial expression symmetric; VIII hearing intact to conversation; IX, X palate rise symmetric; XI sternocleidomastoid strength intact; XII tongue midline, no fasciculations present  Motor: (Right/Left)  Deltoid 5/5, Biceps 5/5, Triceps 5/5, Finger ext 5/5, interossei 5/5, Hip Flexion 5/5, Knee ext 5/5, Knee flex 5/5, Ankle dorsiflexion 5/5, Ankle plantarflexion 5/5  Sensory: Normal to light touch. Proprioception intact at toes. Pinprick is normal. Vibration in seconds R/L toes  Coordination: normal finger nose finger, rapid alternating movements and finger tapping speed normal, and heel to shin smooth without ataxia  Reflexes: (Right/Left) Biceps 2/2, Triceps 2/2, Brachioradialis 2/2, Knee Jerks 2/2, Ankle Jerks 2/2, Toes downgoing  Abnormal Movements: no tremors, no rigidity, no bradykinesia  Gait: normal based, good arm swing, Toe/heel walking without assistance, Tandem gait intact    ASSESSMENT/PLAN:  CLINICAL SUMMARY      AMAKA MANDAL 52 y.o. female with notable history of hypersensitivity pneumonitis, chronic allergies, menopause off of HTR here today for follow up of isolated dysautonomia.     She reports improvement in dizziness due to trigger avoidance. She has been struggling with brain fog and diffuse muscle soreness/tension. We discussed ongoing monitoring for autonomic dysfunction potential contributing factors and symptomatic management. She was encouraged to follow up with her OBGYN for HRT as stopping this was associated with recurrence of brain fog and muscle soreness. We will prescribe baclofen 5 mg TID and refer to PT in the mean time.     We continue to entertain multiple possibilities for the underlying cause of her dysautonomia. She is not showing any other signs of parkinsonism to suggest synucleinopathy. I offered to obtain skin biopsy looking for this but she wanted to defer for now which I think it reasonable.     Impression:  1. Autonomic dysfunction     RECOMMENDATIONS  1. Baclofen 5 mg TID, titrate to symptoms   2. Referral to PT   3. Clinical monitoring/trigger avoidance for dysautonomia     MANAGEMENT AND PLAN  During the visit I discussed my impression, recommended diagnostic studies, prognosis, risks and benefits of management, instructions for management, and importance of  compliance.  After a discussion, the patient agrees with the plan.     Total Time Today was 30 minutes in the following activities: Preparing to see the patient, Obtaining and/or reviewing separately obtained history, Performing a medically appropriate examination and/or evaluation, Counseling and educating the patient/family/caregiver and Ordering medications, tests, or procedures    FOLLOWUP PLAN  Return in about 6 months (around 08/27/2023).  The patient is instructed to contact me if there are any concerns with the agreed plan.    Barb Merino, MD  Clinical Assistant Professor   Department of Neurology  [facility]

## 2023-02-26 ENCOUNTER — Ambulatory Visit: Admit: 2023-02-26 | Discharge: 2023-02-27 | Payer: BC Managed Care – PPO

## 2023-02-26 ENCOUNTER — Encounter: Admit: 2023-02-26 | Discharge: 2023-02-26 | Payer: BC Managed Care – PPO

## 2023-02-26 DIAGNOSIS — R112 Nausea with vomiting, unspecified: Secondary | ICD-10-CM

## 2023-02-26 DIAGNOSIS — K9041 Non-celiac gluten sensitivity: Secondary | ICD-10-CM

## 2023-02-26 DIAGNOSIS — R42 Dizziness and giddiness: Secondary | ICD-10-CM

## 2023-02-26 DIAGNOSIS — H547 Unspecified visual loss: Secondary | ICD-10-CM

## 2023-02-26 DIAGNOSIS — H538 Other visual disturbances: Secondary | ICD-10-CM

## 2023-02-26 DIAGNOSIS — R519 Generalized headaches: Secondary | ICD-10-CM

## 2023-02-26 DIAGNOSIS — E039 Hypothyroidism, unspecified: Secondary | ICD-10-CM

## 2023-02-26 DIAGNOSIS — G909 Disorder of the autonomic nervous system, unspecified: Secondary | ICD-10-CM

## 2023-02-26 DIAGNOSIS — R1319 Other dysphagia: Secondary | ICD-10-CM

## 2023-02-26 DIAGNOSIS — F419 Anxiety disorder, unspecified: Secondary | ICD-10-CM

## 2023-02-26 DIAGNOSIS — J679 Hypersensitivity pneumonitis due to unspecified organic dust: Secondary | ICD-10-CM

## 2023-02-26 DIAGNOSIS — J302 Other seasonal allergic rhinitis: Secondary | ICD-10-CM

## 2023-02-26 DIAGNOSIS — R053 Chronic cough: Secondary | ICD-10-CM

## 2023-02-26 DIAGNOSIS — M542 Cervicalgia: Secondary | ICD-10-CM

## 2023-02-26 MED ORDER — BACLOFEN 5 MG PO TAB
5 mg | ORAL_TABLET | Freq: Three times a day (TID) | ORAL | 5 refills | Status: AC
Start: 2023-02-26 — End: ?

## 2023-03-12 ENCOUNTER — Encounter: Admit: 2023-03-12 | Discharge: 2023-03-12 | Payer: BC Managed Care – PPO

## 2023-03-26 ENCOUNTER — Encounter: Admit: 2023-03-26 | Discharge: 2023-03-26 | Payer: BC Managed Care – PPO

## 2023-04-10 ENCOUNTER — Encounter: Admit: 2023-04-10 | Discharge: 2023-04-10 | Payer: BC Managed Care – PPO

## 2023-04-11 ENCOUNTER — Encounter: Admit: 2023-04-11 | Discharge: 2023-04-11 | Payer: BC Managed Care – PPO

## 2023-04-14 ENCOUNTER — Encounter: Admit: 2023-04-14 | Discharge: 2023-04-14 | Payer: BC Managed Care – PPO

## 2023-04-14 ENCOUNTER — Ambulatory Visit: Admit: 2023-04-14 | Discharge: 2023-04-15 | Payer: BC Managed Care – PPO

## 2023-04-17 ENCOUNTER — Encounter: Admit: 2023-04-17 | Discharge: 2023-04-17 | Payer: BC Managed Care – PPO

## 2023-06-08 ENCOUNTER — Encounter: Admit: 2023-06-08 | Discharge: 2023-06-08 | Payer: BC Managed Care – PPO

## 2023-09-16 ENCOUNTER — Encounter: Admit: 2023-09-16 | Discharge: 2023-09-16 | Payer: BLUE CROSS/BLUE SHIELD

## 2023-09-24 ENCOUNTER — Encounter: Admit: 2023-09-24 | Discharge: 2023-09-24 | Payer: BLUE CROSS/BLUE SHIELD

## 2023-09-30 ENCOUNTER — Encounter: Admit: 2023-09-30 | Discharge: 2023-09-30 | Payer: BLUE CROSS/BLUE SHIELD

## 2023-10-09 ENCOUNTER — Ambulatory Visit: Admit: 2023-10-09 | Discharge: 2023-10-09 | Payer: BLUE CROSS/BLUE SHIELD

## 2023-10-09 ENCOUNTER — Encounter: Admit: 2023-10-09 | Discharge: 2023-10-09 | Payer: BLUE CROSS/BLUE SHIELD

## 2023-10-09 DIAGNOSIS — M542 Cervicalgia: Secondary | ICD-10-CM

## 2023-10-09 DIAGNOSIS — M545 Chronic bilateral low back pain without sciatica: Secondary | ICD-10-CM

## 2023-10-09 DIAGNOSIS — R5383 Other fatigue: Secondary | ICD-10-CM

## 2023-10-09 NOTE — Progress Notes
 Subjective:       History of Present Illness  Danielle Garner is a 53 y.o. female who is patient of Dr. Janelle Mediate that presents today for follow up of autonomic dysfunction. Please see below for any updates:     Today she presents with her husband. She states that things have been going well. A couple of months ago she has had a dizzy spell and has a feeling like she is going to black out, but it goes away with time. She was having these spells daily and now she is having 2 spells in the last 2 weeks so she has had some improvement. During this time she will have head pressure. Episodes last for a couple of minutes. She has constant TMJ pain. She has tried 2 onabotulinumtoxin A treatments, she did her last round last Friday.     She feels like she continues to have brain fog but the short term memory does not seem as bad. She said she wont be able to remember simple things such as what she had for dinner last night. But she does feel like these symptoms have improved.     She is on Baclofen 5mg  TID --> this has not been helpful, and it made her drowsy. She stopped this.     She has completed PT and got benefit of her range of motion.     Her muscle tension is her biggest concern. It is mainly in her neck and shoulders.    She states her back is always tight and she has tightness in her forearms. She does state she types a lot at work and she states she knows that it contributes.     She has constant neck pain.     She continues to have chronic constipation. Followed up with GI previously about this.    She has tried Midodrine in the past but did not feel well on this. She quit taking this.       Last Clinical Summary 02/26/23:  In the interim, she has noticed worsening muscle tension in her neck and upper back. This is a relatively diffuse in its involvement. She is taking robaxin 750 mg once daily. She has jaw tightness that she has received onabotulinumtoxinA for TMJ that did not help. Cyclobenzaprine in the past did not help.     She is having issues with brain fog and short term memory issues. This is intermittent and not everyday. She has noticed intermittent balance issues in the mornings. She has noticed a poor sense of smell.     She denies significant issues with dizziness.     Clinical summary at last visit 07/05/22:   Since the cessation of spironolactone and implementing lifestyle changes, she reports no issues with dizziness episodes. We discussed the cause of this remains undetermined at this time. She denies heat intolerance, abnormal sweating, tremor, gait issues, muscle stiffness, mood changes, palpitations, fatigue, poor sense of smell, neuropathy, GI upset, dry eyes or eye mouth, or vision changes. She does have longstanding issues with constipation but she feels this is manageable at this time. We agreed to proceed with clinical monitoring for the time being.     Clinical summary at last visit 01/02/22:   Interval tilt table testing confirmed autonomic dysfunction (sympathoadrenal dysfunction). There is no history of T2DM or neuropathy. She does complain of poor sense of smell and rarely acting out her dreams at night (rare). 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 She does not  display any other parkinsonian symptoms at this time. We discussed completing skin biopsy and starting fluorinef for symptomatic management. She did not tolerate midodrine so we will stop this.     Clinical summary at last visit 08/15/21:   Spells began insidiously about 1 year ago. These occur about 15x per month and consist of involuntarily holding her breath for 4-5 seconds followed by a mild-moderate bitemporal headache, vision blurring + tunnel vision and dizziness lasting 5-10 seconds without interruption in consciousness and immediately returns to baseline. This tends to occur while she is upright and exerting herself doing household chores. This does not occur in a lying position. Neuro exam is unremarkable today. MRI head in 04/2021 is normal.     We discussed my impression of breath holding likely causative of presyncope. We discussed optimization of fluids, salt intake, compression stockings, crossing legs/tensing muscle prior to position changes. Will obtain echo, CTA head and neck to look for vertebrobasilar insufficiency. We also discuss treatment options for breath holding. I asked that she also speak to her Pulmonologist about these symptoms. Neurologic diagnostic considerations include motor tics and autonomic dysfunction. Will order autonomic testing and tilt table testing to investigate further. Consider trial of guanfacine or tetrabenazine.     HPI:   Spells + headache   Onset: 2022, insidious. She will hold her breath for 5 seconds or so (involuntary)     Location: bitemporal   Quality: pressure-like   Severity: 3-7/10  Total headache days per month: 15   Duration: 5 seconds   Associated symptoms: blurred vision, tunnel vision    Denies: lacrimation/rhinorrhea, positional changes, ringing in ears/roaring sounds   Aura: no   Triggers: occurs exclusively in the upright position, while doing chores around the house   Sleep/snoring: muscle tension, neck pan, grind her teeth, takes methocarbamol to help her sleep  Hx of head injury: no   Family hx of migraine: no  Last Eye exam: annual   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     She reports good oral intake. She does not add salt to her food. She denies LOC.     Relevant PMHx:  Anxiety/depression   Chronic headaches   Dizziness   Menopause on HRT   Chronic neck and muscle tension   Seasonal allergies   Chronic cough related silent reflux     Prior Workup:   [facility] MRI Head 05/04/21: normal  [place] Tilt table 10/2021: + of dysautonomia, sympathoadrenal dysfunction suggested and low cardiovagal function   [place] CTA head and neck 10/2021: no significant stenosis, degenerative spine changes __________________________________________________________________________________    Past Medical History:    Anxiety disorder    Blurry vision    Chronic cough    Dizziness    Generalized headaches    GERD (gastroesophageal reflux disease)    Gluten intolerance    Headache    High cholesterol    Hypersensitivity pneumonitis (CMS-HCC)    Hypothyroidism    Infection    Lung disease    Other dysphagia    PONV (postoperative nausea and vomiting)    Seasonal allergic reaction    Unspecified deficiency anemia    Vision decreased     Surgical History:   Procedure Laterality Date    LUNG BIOPSY Right 02/03/2009    TENOTOMY Left 02/08/2016    arm-elbow    ESOPHAGOGASTRODUODENOSCOPY WITH SPECIMEN COLLECTION BY BRUSHING/ WASHING  N/A 06/21/2021    Performed by Matha Solo, MD at [facility] OR    ESOPHAGOGASTRODUODENOSCOPY WITH BIOPSY - FLEXIBLE N/A 06/21/2021    Performed by Matha Solo, MD at [facility] OR    HX BACK SURGERY      TILT TABLE STUDY       Social History     Tobacco Use    Smoking status: Former     Current packs/day: 0.00     Types: Cigarettes     Quit date: 09/03/1992     Years since quitting: 31.1    Smokeless tobacco: Never    Tobacco comments:     Quit smoking 42yrs ago   Substance Use Topics    Alcohol use: Yes     Alcohol/week: 14.0 standard drinks of alcohol     Types: 4 Glasses of wine, 10 Drinks containing 0.5 oz of alcohol per week     Comment: weekends only    Drug use: Never     Family History   Problem Relation Name Age of Onset    Diabetes Type II Father Alvie Jolly     Diabetes Father Alvie Jolly         Diagnosed 2022    Hearing Loss Father Alvie Jolly     Thyroid Disease Mother Leward Record      Allergies   Allergen Reactions    Gluten DIARRHEA, FLATULENCE, NAUSEA AND VOMITING, SEE COMMENTS, RASH and STOMACH UPSET     Digestive problems and rash      Almond UNKNOWN    Asa [Aspirin] SEE COMMENTS     Nose bleeds      Egg UNKNOWN     Review of Systems  ROS negative unless otherwise specified in HPI    Objective: ADVAIR HFA 230-21 mcg/actuation inhaler     ALPRAZolam (XANAX) 0.25 mg tablet     EPINEPHrine (EPIPEN 2-PAK) 1 mg/mL injection pen (2-Pack) Inject 0.3 mg (1 Pen) into thigh if needed for anaphylactic reaction. May repeat in 5-15 minutes if needed.    estradioL (ESTRACE) 1 mg tablet Take one-half tablet by mouth daily.    ezetimibe (ZETIA) 10 mg tablet Take one tablet by mouth daily.    famotidine (PEPCID) 40 mg tablet TAKE ONE TABLET BY MOUTH EVERY DAY 30 MINUTES before dinner    levothyroxine (SYNTHROID) 75 mcg tablet Take one tablet by mouth daily 30 minutes before breakfast.    montelukast (SINGULAIR) 10 mg tablet     omeprazole DR (PRILOSEC) 40 mg capsule TAKE ONE CAPSULE BY MOUTH EVERY DAY before breakfast, TAKE 30 MINUTES before a meal    progesterone, micronized (PROMETRIUM) 100 mg capsule      Vitals:    10/09/23 1330   BP: 103/64   BP Source: Arm, Right Upper   Pulse: 64   SpO2: 98%   PainSc: Zero       There is no height or weight on file to calculate BMI.     General: alert, oriented x 3  Speech: normal, no dysarthria  Ext: no leg edema  Cranial nerves: II Visual fields full to finger counting; III, IV, VI PERRL, extraocular muscles intact, no nystagmus; V facial sensation intact; VII facial expression symmetric; VIII hearing intact to conversation;  Motor: (Right/Left)  Deltoid 5/5, Biceps 5/5, Triceps 5/5, Finger ext 5/5, interossei 5/5, Hip Flexion 5/5, Knee ext 5/5, Knee flex 5/5, Ankle dorsiflexion 5/5, Ankle plantarflexion 5/5  Sensory: Normal to light touch.   Coordination: normal finger nose  finger, rapid alternating movements and finger tapping speed normal, and heel to shin smooth without ataxia  Reflexes: (Right/Left) Biceps 2/2, Triceps 2/2, Brachioradialis 2/2, Knee Jerks 2/2, Ankle Jerks 2/2,   Abnormal Movements: no tremors, no rigidity, no bradykinesia  Gait: normal based, good arm swing    ASSESSMENT/PLAN:  CLINICAL SUMMARY      Danielle Garner 52 y.o. female with notable history of hypersensitivity pneumonitis, chronic allergies, menopause off of HTR here today for follow up of isolated dysautonomia.       Impression:  1. Autonomic dysfunction     RECOMMENDATIONS  Order MRI C spine wo contrast for worsening neck pain  Order MRI L Spine wo contrast for worsening lower back pain  Refer to pain clinic for ongoing back pain  Order Vitamin B12, Vitamin D and iron panel for ongoing fatigue.  Could consider retrial of Midodrine in the future --> Danielle Garner states she will reach out when she is ready to retry.     MANAGEMENT AND PLAN  During the visit I discussed my impression, recommended diagnostic studies, prognosis, risks and benefits of management, instructions for management, and importance of compliance.  After a discussion, the patient agrees with the plan.     FOLLOWUP PLAN  Return in about 3 months (around 01/09/2024) for In-Person, Telehealth.   The patient is instructed to contact me if there are any concerns with the agreed plan.    Total of 25 minutes were spent on the same day of the visit including preparing to see the patient, obtaining and/or reviewing separately obtained history, performing a medically appropriate examination and/or evaluation, counseling and educating the patient/family/caregiver, ordering medications, tests, or procedures, referring and communication with other health care professionals, documenting clinical information in the electronic or other health record independently interpreting results and communicating results to the patient/family/caregiver, and care coordination.

## 2023-10-10 ENCOUNTER — Encounter: Admit: 2023-10-10 | Discharge: 2023-10-10 | Payer: BLUE CROSS/BLUE SHIELD

## 2023-10-10 DIAGNOSIS — G8929 Other chronic pain: Secondary | ICD-10-CM

## 2023-10-21 ENCOUNTER — Encounter: Admit: 2023-10-21 | Discharge: 2023-10-21 | Payer: BLUE CROSS/BLUE SHIELD

## 2023-10-21 DIAGNOSIS — M542 Cervicalgia: Secondary | ICD-10-CM

## 2023-10-21 DIAGNOSIS — M545 Chronic bilateral low back pain without sciatica: Secondary | ICD-10-CM

## 2023-10-30 ENCOUNTER — Encounter: Admit: 2023-10-30 | Discharge: 2023-10-30 | Payer: BLUE CROSS/BLUE SHIELD

## 2023-10-30 ENCOUNTER — Ambulatory Visit: Admit: 2023-10-30 | Discharge: 2023-10-30 | Payer: BLUE CROSS/BLUE SHIELD

## 2023-10-30 NOTE — Telephone Encounter
 Advised pt to complete ppw prior to the appt. Also please bring any recent imaging regarding the pain. Advised pt about new check-in process and to arrive 30 mins early.

## 2023-11-01 NOTE — Progress Notes
 [facility] NEW PATIENT [facility]INIC [facility]       Dear Mr. Roscoe KATHEE Alpers,     I appreciate your kind referral of Danielle Garner for evaluation of pain. Please see my note below for the full details of the evaluation and management plan.     Thank you,     Lonni CHRISTELLA Fox, MD    CC:    Chief Complaint   Patient presents with    Neck - Pain    New Patient        Referring Provider: Roscoe KATHEE Alpers     HPI: Danielle Garner has a past medical history of Anxiety disorder, Blurry vision, Chronic cough, Constipation, Dizziness, Fibromyalgia (2016), Generalized headaches (2010) (Under stress or with neck pain), GERD (gastroesophageal reflux disease), Gluten intolerance, Headache, High cholesterol (2024) (medication started in 2024), Hypersensitivity pneumonitis (CMS-HCC), Hypothyroidism, Infection (11/2022) (currently taking antibiotics  - 2nd round), 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 presents for evaluation of back and neck pain.  She is a patient of Lanae Alpers APRN-NP and was seen in clinic on 10/09/23 for follow up on autonomic dysfunction.  During the visit, she noted having neck and back pain for which she was referred here today for further evaluation.  She notes having neck and back pain for years.  She states she feels she has more muscle tension in her arms and legs.  By tension, she means tightness without numbness.  She states she has upper back numbness near the scapula.  She is right hand dominant and denies any changes in hand writing or grip strength.  She otherwise denies any cardiac CP, SOB, or abdominal pain.  She has autonomic dysfunction that does cause some SOB occasionally.      Inciting event:  None that she notes  Pain Location:  neck, back, and scapular pain.    NRS:  5/10 in left upper neck.  Pain Described as: aching  Numbness/Tingling:  No  Weakness:  No    Aggravating Factors:  standing, activities like washing dishes, folding laundry.      Alleviating Factors:  heat, rest    Prior Treatments:  Gabapentin:  No  Lyrica:  No  Cymbalta:  No  Amitriptyline:  No  Nortriptyline:   No  Muscle relaxants:  Yes (robaxin just helps her sleep)  Narcotics:  No  Physical Therapy:  Yes (November to December 2024.  Improved her mobility but she still has pain)  HEP:  Yes  Frequency:  several times a day  Duration:  months  Types of exercises:  neck and back stretches/exercises  Compliance:  Good    Prior Injections:    Lumbar ESI prior to laminectomy     Prior Relevant Surgeries:  Lumbar laminectomy for disc bulge in 2000         Review of Systems   Constitutional:  Positive for appetite change and fatigue. Negative for activity change, chills, diaphoresis, fever and unexpected weight change.   HENT:  Positive for postnasal drip, rhinorrhea and sinus pressure. Negative for congestion, dental problem, drooling, ear discharge, ear pain, facial swelling, hearing loss, mouth sores, nosebleeds, sinus pain, sneezing, sore throat, tinnitus, trouble swallowing and voice change.    Eyes:  Positive for photophobia and itching. Negative for pain, discharge, redness and visual disturbance.   Respiratory:  Negative for cough, choking, chest tightness, shortness of breath, wheezing and  stridor.    Cardiovascular:  Negative for chest pain, palpitations and leg swelling.   Endocrine: Positive for cold intolerance. Negative for heat intolerance, polydipsia, polyphagia and polyuria.   Genitourinary:  Negative for decreased urine volume, difficulty urinating, dyspareunia, dysuria, enuresis, flank pain, frequency, genital sores, hematuria, menstrual problem, pelvic pain, urgency, vaginal bleeding, vaginal discharge and vaginal pain.   Musculoskeletal:  Positive for back pain, myalgias, neck pain and neck stiffness. Negative for arthralgias, gait problem and joint swelling.   Skin:  Negative for color change, pallor, rash and wound.   Allergic/Immunologic: Positive for environmental allergies and food allergies. Negative for immunocompromised state.   Neurological:  Positive for dizziness and light-headedness. Negative for tremors, seizures, facial asymmetry, speech difficulty, weakness, numbness and headaches.   Hematological:  Negative for adenopathy. Does not bruise/bleed easily.   Psychiatric/Behavioral:  Negative for agitation, behavioral problems, confusion, decreased concentration, dysphoric mood, hallucinations, self-injury, sleep disturbance and suicidal ideas. The patient is nervous/anxious. The patient is not hyperactive.        Current Outpatient Medications:     ADVAIR HFA 230-21 mcg/actuation inhaler, Inhale  by mouth into the lungs as Needed., Disp: , Rfl:     ALPRAZolam (XANAX) 0.25 mg tablet, Take one tablet by mouth. Once every other week., Disp: , Rfl:     EPINEPHrine  (EPIPEN  2-PAK) 1 mg/mL injection pen (2-Pack), Inject 0.3 mg (1 Pen) into thigh if needed for anaphylactic reaction. May repeat in 5-15 minutes if needed., Disp: 2 each, Rfl: 0    estradioL (ESTRACE) 1 mg tablet, Take one-half tablet by mouth daily., Disp: , Rfl:     ezetimibe (ZETIA) 10 mg tablet, Take one tablet by mouth daily., Disp: , Rfl:     famotidine  (PEPCID ) 40 mg tablet, TAKE ONE TABLET BY MOUTH EVERY DAY 30 MINUTES before dinner, Disp: 90 tablet, Rfl: 0    levothyroxine (SYNTHROID) 75 mcg tablet, Take one tablet by mouth daily 30 minutes before breakfast., Disp: , Rfl:     methocarbamoL (ROBAXIN) 750 mg tablet, Take one tablet by mouth at bedtime as needed for Spasms., Disp: , Rfl:     montelukast (SINGULAIR) 10 mg tablet, Take one tablet by mouth daily., Disp: , Rfl:     omeprazole  DR (PRILOSEC) 40 mg capsule, TAKE ONE CAPSULE BY MOUTH EVERY DAY before breakfast, TAKE 30 MINUTES before a meal, Disp: 90 capsule, Rfl: 3  Allergies   Allergen Reactions    Gluten DIARRHEA, FLATULENCE, NAUSEA AND VOMITING, SEE COMMENTS, RASH and STOMACH UPSET     Digestive problems and rash      Almond UNKNOWN    Asa [Aspirin] SEE COMMENTS     Nose bleeds      Egg UNKNOWN       Past Medical History:    Anxiety disorder    Blurry vision    Chronic cough    Constipation    Dizziness    Fibromyalgia    Generalized headaches    GERD (gastroesophageal reflux disease)    Gluten intolerance    Headache    High cholesterol    Hypersensitivity pneumonitis (CMS-HCC)    Hypothyroidism    Infection    Lung disease    Other dysphagia    PONV (postoperative nausea and vomiting)    Seasonal allergic reaction    Unspecified deficiency anemia    Vision decreased      Surgical History:   Procedure Laterality Date    LUNG BIOPSY Right 02/03/2009  TENOTOMY Left 02/08/2016    arm-elbow    ESOPHAGOGASTRODUODENOSCOPY WITH SPECIMEN COLLECTION BY BRUSHING/ WASHING N/A 06/21/2021    Performed by Bonnetta Camellia BROCKS, MD at [facility] OR    ESOPHAGOGASTRODUODENOSCOPY WITH BIOPSY - FLEXIBLE N/A 06/21/2021    Performed by Bonnetta Camellia BROCKS, MD at [facility] OR    HX ARTHROSCOPIC SURGERY  07/28/2023    Hysterectomy    HX BACK SURGERY      HX HYSTERECTOMY  07/28/2023    LAMINECTOMY  11/04/1998    SURGERY  11/04/1998    Lower lumbar lamenectomy    TILT TABLE STUDY        Family History   Problem Relation Name Age of Onset    Diabetes Type II Father [place]     Diabetes Father [place]         Diagnosed 2022    Hearing Loss Father [place]     Thyroid Disease Mother Angeline         No data recorded   Is a controlled substance agreement on file?No    Physical examination:   BP 103/44 (BP Source: Arm, Right Upper, Patient Position: Sitting)  - Pulse 68  - LMP  (LMP Unknown)  - SpO2 100%   Pain Score: Five    General: Alert, cooperative, no distress  Head: Normocephalic, atraumatic  Eyes: Conjunctivae/corneas clear  Lungs: Unlabored respirations  Heart: Normal rate by palpation of pulse  Abdomen: Non-distended  Skin: Warm and dry to touch  Psychiatric: Mood and affect normal  Musculoskeletal: Moves all extremities  Neurological: Grossly intact.  CN II to XII intact    MS:   Root Right Left   Shoulder Abduction C5 5 5   Elbow Flexion C5 5 5   Elbow Extension C7 5 5   Wrist Extension C6 5 5   Finger Flexion C8 5 5   Finger Abduction T1 5 5   No pain with cervical facet loading  Negative spurlings bilaterally  Negative hoffmans bilaterally    MS:   Root Right Left   Hip Flexion L2 5 5   Knee Flexion L5/S1 5 5   Knee Extension L3 5 5   Dorsiflexion L4 5 5   Plantarflexion S1 5 5   EHL Extension L5 5 5   Pain with lumbar facet loading  Negative SLR bilaterally  Gait was smooth and symmetric with equal arm swing.       I reviewed the following with the patient:    Spine X-Ray Results:  None new relevant imaging for review.    MRI C-Spine Results:    Results for orders placed during the hospital encounter of 10/30/23    MRI C-SPINE WO CONTRAST    Narrative  MRI cervical spine    Reason for exam: Worsening neck pain, dizziness    Multisequence multiplanar MRI of the cervical spine was performed on a spine coil without IV contrast administration.  Comparison is made to the neck of October 15, 2021.    C2-C3 and C3-C4 discs are normal in appearance.  Retrolisthesis C4 on C5 by 2.5 mm is present with mild broad posterior disc bulge/osteophytic spurring and degenerative changes of apophyseal joints.  There is severe bilateral C4-C5 neural foraminal stenosis.  Retrolisthesis C5 on C6 by 3 mm is present with moderate reduction in disc space height, mild broad posterior disc bulge/osteophytic spurring and degenerative changes of apophyseal joints.  There is moderate C5-C6 spinal stenosis with severe right and moderate  left neural foraminal stenosis  Mild broad right posterior C6-C7 osteophytic spurring/disc bulging is present. With subchondral cyst in right posterior inferior C6 vertebra.  There is right C6-C7 lateral recess stenosis and severe right neural foraminal stenosis.  Minimal anterior subluxation C7 on T1 is present with mild posterior disc bulging and degenerative changes of apophyseal joints.  Cervical vertebrae are intact with mild anterior marginal osteophytic spurring and degenerative marrow signal changes at C4-C5.    Impression  1. Degenerative changes of cervical spine.  2. 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  3. 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  4. Mild broad right posterior C6-C7 osteophytic spurring/disc bulging with right lateral recess stenosis and severe right neural foraminal stenosis.      Finalized by Arlyss Ruth, M.D. on 10/30/2023 1:41 PM. Dictated by Arlyss Ruth, M.D. on 10/30/2023 1:28 PM.    MRI L-Spine Results:  Results for orders placed during the hospital encounter of 10/30/23    MRI L-SPINE WO CONTRAST    Narrative  MRI lumbar spine    Reason for exam: Worsening low back pain, prior lumbar spine surgery    Multisequence multiplanar MRI of the lumbar spine was performed on a spine coil without IV contrast administration.    Minimal posterior L1-L2 disc bulging is present.  L2-L3 and L3-L4 discs have a normal configuration.  L4-5 disc space height is reduced with small central posterior disc protrusion/extrusion with slight caudal extension.  Short pedicles and degenerative changes of L4-L5 apophyseal joints are present.  There is mild L4-L5 spinal stenosis with normal diameter neural foramina.  L5-S1 disc space height is moderately reduced with endplate irregularity and mild circumferential disc bulge/osteophytic spurring extending into the neural foramina bilaterally.  There is mild left L5-S1 neural foraminal stenosis.  Conus medullaris is normal in configuration with normal signal.  Lumbar vertebrae appear intact with normal alignment.  There are degenerative marrow signal changes at L5-S1.    Impression  1. Small central posterior L4-L5 disc protrusion/extrusion with slight caudal extension.  2. Short pedicles and degenerative changes L4-L5 apophyseal joints with mild spinal stenosis.  3. Chronic degenerative changes L5-S1 disc with mild circumferential disc bulge/osteophytic spurring.  4. Mild left L5-S1 neural foraminal stenosis.      Finalized by Arlyss Ruth, M.D. on 10/30/2023 1:58 PM. Dictated by Arlyss Ruth, M.D. on 10/30/2023 1:41 PM.    MRI T-Spine Results:  None new relevant imaging for review.           ASSESSMENT:    1. 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 Myalgia, other site             Danielle Garner presents with a history, exam, and diagnostic work up indicative of cervical radiculopathy.  We reviewed her history, exam and imaging together today.  She notes having neck pain with bilateral arm tightness.  She has an intact physical exam but notes that the neck pain causes this tightness sensation in both arms.  She has no taut bands or increased spasticity in either arm and we discussed this could represent symptoms of radiculopathy given the notable central and NF stenosis she has on MRI worst at C4-5 and C5-6.  She also notes having bilateral back pain that is reproducible with facet  loading.  Given that the neck is most bothersome for her, will plan for a C7-T1 CESI first.  If neck and arm symptoms improved and low back pain persists, we discussed trying MBB/RFA next for low back pain.     Patient has had an adequate trial of > 12 month of rest, exercise, multimodal treatment, and the passage of time without improvement of symptoms. The pain has significant impact on the daily quality of life.     PLAN:      Diagnostics:  None indicated  Therapy:  Continue with HEP  Medication changes:  Continue with OTC analgesics PRN  Interventions:  C7-T1 CESI at first available.    Follow up:  At time of injection    Thank you for this kind referral for consultation. Please feel free to contact me with any questions or concerns.    CC:  Joswick, Tristian B, AP*         Answers submitted by the patient for this visit:  Review Of Systems (Submitted on 11/02/2023)  Crying: No

## 2023-11-02 ENCOUNTER — Encounter: Admit: 2023-11-02 | Discharge: 2023-11-02 | Payer: BLUE CROSS/BLUE SHIELD

## 2023-11-03 ENCOUNTER — Encounter: Admit: 2023-11-03 | Discharge: 2023-11-03 | Payer: BLUE CROSS/BLUE SHIELD

## 2023-11-03 ENCOUNTER — Ambulatory Visit: Admit: 2023-11-03 | Discharge: 2023-11-04 | Payer: BLUE CROSS/BLUE SHIELD

## 2023-11-03 DIAGNOSIS — M7918 Myalgia, other site: Secondary | ICD-10-CM

## 2023-11-03 DIAGNOSIS — M47816 Spondylosis without myelopathy or radiculopathy, lumbar region: Secondary | ICD-10-CM

## 2023-11-04 DIAGNOSIS — M545 Low back pain, unspecified: Secondary | ICD-10-CM

## 2023-11-04 DIAGNOSIS — G8929 Other chronic pain: Secondary | ICD-10-CM

## 2023-11-04 DIAGNOSIS — M5412 Radiculopathy, cervical region: Secondary | ICD-10-CM

## 2023-11-04 DIAGNOSIS — M542 Cervicalgia: Principal | ICD-10-CM

## 2023-11-22 NOTE — Progress Notes
 [facility]  INTERVENTIONAL PAIN PROCEDURE HISTORY AND PHYSICAL    Cc:  pain    HISTORY OF PRESENT ILLNESS:  Patient presents today with pain and is here for injection.  Patient denies any new medical conditions or medications.  Patient states they have been off of anticoagulants and antiplatelets as instructed during clinic visit.  Patient denies any recent illnesses, infections, and is off of antibiotics     Past Medical History:    Anxiety disorder    Blurry vision    Chronic cough    Constipation    Dizziness    Fibromyalgia    Generalized headaches    GERD (gastroesophageal reflux disease)    Gluten intolerance    Headache    High cholesterol    Hypersensitivity pneumonitis (CMS-HCC)    Hypothyroidism    Infection    Lung disease    Other dysphagia    PONV (postoperative nausea and vomiting)    Seasonal allergic reaction    Unspecified deficiency anemia    Vision decreased       Surgical History:   Procedure Laterality Date    LUNG BIOPSY Right 02/03/2009    TENOTOMY Left 02/08/2016    arm-elbow    ESOPHAGOGASTRODUODENOSCOPY WITH SPECIMEN COLLECTION BY BRUSHING/ WASHING N/A 06/21/2021    Performed by Bonnetta Camellia BROCKS, MD at [facility] OR    ESOPHAGOGASTRODUODENOSCOPY WITH BIOPSY - FLEXIBLE N/A 06/21/2021    Performed by Bonnetta Camellia BROCKS, MD at [facility] OR    HX ARTHROSCOPIC SURGERY  07/28/2023    Hysterectomy    HX BACK SURGERY      HX HYSTERECTOMY  07/28/2023    LAMINECTOMY  11/04/1998    SURGERY  11/04/1998    Lower lumbar lamenectomy    TILT TABLE STUDY         family history includes Diabetes in her father; Diabetes Type II in her father; Hearing Loss in her father; Thyroid Disease in her mother.    Social History     Socioeconomic History    Marital status: Married   Tobacco Use    Smoking status: Former     Current packs/day: 0.00     Average packs/day: 1 pack/day for 5.0 years (5.0 ttl pk-yrs)     Types: Cigarettes     Quit date: 09/03/1992     Years since quitting: 31.2    Smokeless tobacco: Never    Tobacco comments:     Quit smoking 18yrs ago   Substance and Sexual Activity    Alcohol use: Yes     Alcohol/week: 18.0 standard drinks of alcohol     Types: 4 Glasses of wine, 10 Drinks containing 0.5 oz of alcohol, 4 Standard drinks or equivalent per week     Comment: weekends only    Drug use: Never    Sexual activity: Yes     Partners: Male     Birth control/protection: Surgical       Allergies   Allergen Reactions    Gluten DIARRHEA, FLATULENCE, NAUSEA AND VOMITING, SEE COMMENTS, RASH and STOMACH UPSET     Digestive problems and rash      Almond UNKNOWN    Asa [Aspirin] SEE COMMENTS     Nose bleeds      Egg UNKNOWN       Vitals:    11/24/23 1332 11/24/23 1421 11/24/23 1424 11/24/23 1428   BP: 123/68 97/67 107/64 106/69   BP Source: Arm, Right Upper Arm, Right Upper  Arm, Right Upper Arm, Right Upper   Pulse: 64 62  60   Temp: 36.7 ?C (98 ?F)      SpO2: 100% 99%  100%   O2 Device: None (Room air) None (Room air)  None (Room air)   Weight: 68 kg (150 lb)      Height: 165.1 cm (5' 5)          Pre procedural pain:  7  Post procedural pain:  0    REVIEW OF SYSTEMS: 14 point ROS obtained and negative except for those noted in HPI.      PHYSICAL EXAM:  General: Alert, cooperative, no distress  Head: Normocephalic, atraumatic  Eyes: Conjunctivae/corneas clear  Lungs: Unlabored respirations  Heart: Normal rate by palpation of pulse  Abdomen: Non-distended  Skin: Warm and dry to touch  Psychiatric: Mood and affect normal  Musculoskeletal: Moves all extremities  Neurological: Grossly intact.      IMPRESSION:    1. Cervical radiculopathy          PLAN:   Plan to proceed with C7-T1 CESI.      Lonni CHRISTELLA Fox, MD  Department of Anesthesiology and Pain Medicine  [facility]  Available on Voalte

## 2023-11-24 ENCOUNTER — Encounter: Admit: 2023-11-24 | Discharge: 2023-11-24 | Payer: BLUE CROSS/BLUE SHIELD

## 2023-11-24 ENCOUNTER — Ambulatory Visit: Admit: 2023-11-24 | Discharge: 2023-11-24 | Payer: BLUE CROSS/BLUE SHIELD

## 2023-11-24 DIAGNOSIS — M5412 Radiculopathy, cervical region: Principal | ICD-10-CM

## 2023-11-24 MED ORDER — IOHEXOL 300 MG IODINE/ML IV SOLN
1 mL | Freq: Once | 0 refills | Status: CP
Start: 2023-11-24 — End: ?

## 2023-11-24 MED ORDER — LIDOCAINE (PF) 10 MG/ML (1 %) IJ SOLN
5 mL | Freq: Once | INTRAMUSCULAR | 0 refills | Status: CP
Start: 2023-11-24 — End: ?

## 2023-11-24 MED ORDER — SODIUM CHLORIDE 0.9 % IJ SOLN
5 mL | Freq: Once | INTRAMUSCULAR | 0 refills | Status: CP
Start: 2023-11-24 — End: ?

## 2023-11-24 MED ORDER — TRIAMCINOLONE ACETONIDE 40 MG/ML IJ SUSP
80 mg | Freq: Once | EPIDURAL | 0 refills | Status: CP
Start: 2023-11-24 — End: ?

## 2023-11-24 NOTE — Procedures
 Attending Surgeon: Lonni CHRISTELLA Fox, MD    Anesthesia: Local      Epidural Steroid Injection Cervical/Thoracic  Procedure: epidural - interlaminar    Laterality: n/a   on 11/24/2023 2:00 PM  Location: cervical ESI with imaging - C7-T1      Consent:   Consent obtained: verbal and written  Consent given by: patient  Risks discussed: allergic reaction, bleeding, infection, nerve damage and seizure  Alternatives discussed: no treatment  Discussed with patient the purpose of the treatment/procedure, other ways of treating my condition, including no treatment/ procedure and the risks and benefits of the alternatives. Patient has decided to proceed with treatment/procedure.        Universal Protocol:  Relevant documents: relevant documents present and verified  Test results: test results available and properly labeled  Imaging studies: imaging studies available  Required items: required blood products, implants, devices, and special equipment available  Site marked: the operative site was marked  Patient identity confirmed: Patient identify confirmed verbally with patient.        Time out: Immediately prior to procedure a time out was called to verify the correct patient, procedure, equipment, support staff and site/side marked as required      Procedures Details:   Indications: pain   Prep: chlorhexidine  Patient position: prone  Estimated Blood Loss: minimal  Specimens: none  Number of Levels: 1  Approach: midline  Guidance: fluoroscopy  Contrast: Procedure confirmed with contrast under live fluoroscopy.  Needle and Epidural Catheter: tuohy  Needle size: 18 G  Injection procedure: Incremental injection, Negative aspiration for blood and Introduced needle  Amount Injected:   C7-T1: 4mL  Patient tolerance: Patient tolerated the procedure well with no immediate complications. Pressure was applied, and hemostasis was accomplished.  Outcome: Pain unchanged  Comments: INTERVENTIONAL PAIN MANAGEMENT PROCEDURE REPORT    Cervical Epidural Steroid Injection    Date of Service: 11/24/23     Procedure Title(s):    1. C7-T1 epidural injection   2. Intraoperative fluoroscopy     Attending:  Lonni CHRISTELLA Fox, MD    Anesthesia: Local anesthetic    Indications: Danielle Garner is a 53 y.o. female with a diagnosis of Cervical radiculopathy. The patient's history and physical exam were reviewed. The risks, benefits and alternatives to the procedure were discussed, and all questions were answered to the patient's satisfaction. The patient agreed to proceed, and written informed consent was obtained.     Procedure in Detail: IV was started? No    The patient was brought into the procedure room and placed in the prone position on the fluoroscopy table. Standard monitors were placed, and vital signs were observed throughout the procedure. The area of the Cervical spine was prepped with 2% chlorhexidine and draped in a sterile manner.     The C7-T1 interspace was identified and marked under AP fluoroscopy. The skin and subcutaneous tissues in the area were anesthetized with 1% lidocaine . An 18-gauge Tuohy epidural needle was directed toward the interspace under fluoroscopic guidance until the ligamentum flavum was engaged. From this point, using lateral and oblique fluoroscopic guidance, a loss of resistance technique with a glass syringe and saline/air was used to identify entrance of the needle into the epidural space.  Once an appropriate loss of resistance was obtained, negative aspiration was confirmed and 0.5-29ml of contrast solution was injected. An appropriate epidurogram was noted on AP and lateral fluoroscopy with no vascular or intrathecal uptake.    Then, after negative aspiration, a  solution containing 80 mg of kenalog  and 2-3 mL of preservative-free saline was easily injected. The needle was removed with a saline flush. The patient's back was cleaned and a bandage was placed over the site of needle insertion.    Disposition: The patient tolerated the procedure well, and there were no apparent complications. Vital signs remained stable througtout the procedure. The patient was taken to the recovery area where discharge instructions for the procedure were given.     Estimated Blood Loss: Minimal    Specimens: None    Complications: None    This patient's clinical history, exam, AND imaging support radiculopathy?AND?there is a significant impact on quality of life and function AND the pain has been present for at least 4 weeks AND they have failed to improve with noninvasive conservative care.?         This patient's clinical history, exam, AND imaging support radiculopathy AND there is a significant impact on quality of life and function AND the pain has been present for at least 4 weeks AND they have failed to improve with noninvasive conservative care.        Estimated blood loss: none or minimal  Specimens: none  Patient tolerated the procedure well with no immediate complications. Pressure was applied, and hemostasis was accomplished.  Administrations This Visit       iohexoL  (OMNIPAQUE -300) 300 mg/mL injection 1 mL       Admin Date  11/24/2023 Action  Given Dose  1 mL Route  SEE ADMIN INSTRUCTIONS Documented By  Baker, Macie, RN              lidocaine  PF 1% (10 mg/mL) injection 5 mL       Admin Date  11/24/2023 Action  Given Dose  5 mL Route  Injection Documented By  Baker, Macie, RN              sodium chloride  PF 0.9% injection 5 mL       Admin Date  11/24/2023 Action  Given Dose  5 mL Route  Injection Documented By  Baker, Macie, RN              triamcinolone  acetonide (KENALOG -40) injection 80 mg       Admin Date  11/24/2023 Action  Given Dose  80 mg Route  Epidural Documented By  Baker, Macie, RN

## 2023-11-24 NOTE — Discharge Instructions - Supplementary Instructions
 GENERAL POST PROCEDURE INSTRUCTIONS  Physician: _________________________________  Procedure Completed Today:  Joint Injection (hip, knee, shoulder)  Cervical Epidural Steroid Injection  Cervical Transforaminal Steroid Injection  Trigger Point Injection  Caudal Epidural Steroid Injection  Piriformis Injection  Pudendal Nerve Block  Other _____________________ Thoracic Epidural Steroid Injection  Lumbar Epidural Steroid Injection  Lumbar Transforaminal Steroid Injection  Facet Joint Injection  Celiac Nerve Block  Sacrococcygeal  Sacroiliac Joint Injection   Important information following your procedure today:  You may drive today     If you had sedation, you may NOT drive today  Rest at home for the next 6 hours.  You may then begin to resume your normal activities.  DO NOT drive any vehicle, operate any power tools, drink alcohol, make any major decisions, or sign any legal documents for the next 12 hours.  Pain relief may not be immediate. It is possible you may even experience an increase in pain during the first 24-48 hours followed by a gradual decrease of your pain.  Though the procedure is generally safe, and complications are rare, we do ask that you be aware of any of the following:  Any swelling, persistent redness, new bleeding or drainage from the site of the injection.  You should not experience a severe headache.  You should not run a fever over 101oF.  New onset of sharp, severe back and or neck pain.  New onset of upper or lower extremity numbness or weakness.  New difficulty controlling bowel or bladder function after injection.  New shortness of breath.  ** If any of these occur, please call to report this occurrence to the nurse of Dr. Shayne Alken at [phone]. If you are calling after 4:00 p.m. or on weekends or holidays, please call [phone] and ask to have the resident physician on call for the physician paged or go to your local emergency room.  You may experience soreness at the injection site. Ice can be applied at 20-minute intervals for the first 24 hours. The following day you may alternate ice with heat if you are experiencing muscle tightness, otherwise continue with ice. Ice works best at decreasing pain. Avoid application of direct heat, hot showers or hot tubs today.  Avoid strenuous activity today. You many resume your regular activities and exercise tomorrow.  Patients with diabetes may see an elevation in blood sugars for 7-10 days after the injection. It is important to pay close attention to your diet, check your blood sugars daily and report extreme elevations to the physician that manages your diabetes.  Patients taking daily blood thinners can resume their regular dose this evening.  It is important that you take all medications ordered by your pain physician. Taking medications as ordered is an important part of your pain care plan. If you cannot continue the medication plan, please notify the physician.    Possible side effects to steroids that may occur:  Flushing or redness of the face  Irritability  Fluid retention  Change in women's menses  Minor headache    If you are unable to keep your upcoming appointment, please notify the [facility] scheduler at [phone] at least 24 hours in advance. If you have questions for the surgery center, call [facility] at [phone].

## 2023-12-02 ENCOUNTER — Encounter: Admit: 2023-12-02 | Discharge: 2023-12-02 | Payer: BLUE CROSS/BLUE SHIELD

## 2023-12-09 ENCOUNTER — Encounter: Admit: 2023-12-09 | Discharge: 2023-12-09 | Payer: BLUE CROSS/BLUE SHIELD

## 2023-12-10 ENCOUNTER — Encounter: Admit: 2023-12-10 | Discharge: 2023-12-10 | Payer: BLUE CROSS/BLUE SHIELD

## 2023-12-10 ENCOUNTER — Ambulatory Visit: Admit: 2023-12-10 | Discharge: 2023-12-11 | Payer: BLUE CROSS/BLUE SHIELD

## 2023-12-10 DIAGNOSIS — M5416 Radiculopathy, lumbar region: Principal | ICD-10-CM

## 2023-12-10 DIAGNOSIS — M5412 Radiculopathy, cervical region: Secondary | ICD-10-CM

## 2023-12-10 NOTE — Progress Notes
 TELEHEALTH (TELEMEDICINE) ENCOUNTER    Meeting Platform: Social worker     Obtained patient's verbal consent to treat them and their agreement to [employer] and NPP via this telehealth visit during the [employer] Emergency.    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 presents for follow up of neck pain.   She states the first week, she felt great.  She was able to play golf and drive a golf cart for the first time in a long time.  She states over time, it has worn off some.  She no longer has the numbness and tingling sensation in the upper back and shoulder blades.  She has improved functioning of the right arm.    What bothers her the most right now is her low back and left leg.  Last Thursday, they drove to the [place] of the [place] where she felt her back, hip and left leg was quite painful.  She presents today for follow up.     Procedure:  C7-T1 CESI  Procedure Date:  11/24/23  Pain site specific NRS:  5/10  Patient reported % relief:   >50%          Review of Systems   Musculoskeletal:  Positive for back pain and neck pain.        Left leg pain  Arm pain   All other systems reviewed and are negative.    Current Medications[1]  Allergies[2]  Physical Exam  There were no vitals filed for this visit.  Oswestry Total Score:: (Patient-Rptd) 22     There is no height or weight on file to calculate BMI.      Physical examination:     VS:   LMP  (LMP Unknown)        Patient has h/o HTN: No  Patient reports being afebrile Yes    Exam performed by instructing patient to perform various maneuvers and provide feedback while I personally visualized the patient exam.     General: The patient is a well-developed, well nourished 53 y.o. female in no acute distress.   HEENT: Head is normocephalic and atraumatic. Pupils are equal bilaterally.   Cardiac: Patient reports regular pulse on palpation.   Pulmonary: The patient has unlabored respirations and bilateral symmetric chest excursion.   Abdomen: Soft, nontender, and nondistended. There is no rebound or guarding reported with patient palpation.   Extremities: No peripheral edema reported by patient or seen on video.     Neurologic:   The patient is alert and oriented times 3.   Cranial nerves II through XII are intact without any focal deficits.     Musculoskeletal:   Gait is normal.    C-Spine   There is no paraspinal tenderness. Paraspinal muscle tone is normal.  ROM with flexion, extension, rotation, and lateral bending is intact.  Patient believes  strength is equal and adequate bilaterally in the flexors and extensors of the bilateral upper extremities.   Hoffman's is negative bilaterally.    L-Spine   There is no paraspinal tenderness. Paraspinal muscle tone is normal.  Facet loading is negative.  There is no tenderness or radiating pain with palpation over the SI joints or greater trochanteric bursae bilaterally.  ROM with flexion, extension, rotation, and lateral bending is intact.  Patient believes strength is equal and adequate bilaterally in the flexors and extensors of the bilateral lower extremities.   SLR is negative bilaterally.   ]MRI C-Spine Results:     Results for orders placed during the hospital encounter of 10/30/23     MRI C-SPINE WO CONTRAST     Narrative  MRI cervical spine     Reason for exam: Worsening neck pain, dizziness     Multisequence multiplanar MRI of the cervical spine was performed on a spine coil without IV contrast administration.  Comparison is made to the neck of October 15, 2021.     C2-C3 and C3-C4 discs are normal in appearance.  Retrolisthesis C4 on C5 by 2.5 mm is present with mild broad posterior disc bulge/osteophytic spurring and degenerative changes of apophyseal joints.  There is severe bilateral C4-C5 neural foraminal stenosis.  Retrolisthesis C5 on C6 by 3 mm is present with moderate reduction in disc space height, mild broad posterior disc bulge/osteophytic spurring and degenerative changes of apophyseal joints.  There is moderate C5-C6 spinal stenosis with severe right and moderate left neural foraminal stenosis  Mild broad right posterior C6-C7 osteophytic spurring/disc bulging is present. With subchondral cyst in right posterior inferior C6 vertebra.  There is right C6-C7 lateral recess stenosis and severe right neural foraminal stenosis.  Minimal anterior subluxation C7 on T1 is present with mild posterior disc bulging and degenerative changes of apophyseal joints.  Cervical vertebrae are intact with mild anterior marginal osteophytic spurring and degenerative marrow signal changes at C4-C5.     Impression  1. Degenerative changes of cervical spine.  2. 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  3. 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  4. Mild broad right posterior C6-C7 osteophytic spurring/disc bulging with right lateral recess stenosis and severe right neural foraminal stenosis.        Finalized by Arlyss Ruth, M.D. on 10/30/2023 1:41 PM. Dictated by Arlyss Ruth, M.D. on 10/30/2023 1:28 PM.     MRI L-Spine Results:  Results for orders placed during the hospital encounter of 10/30/23     MRI L-SPINE WO CONTRAST     Narrative  MRI lumbar spine     Reason for exam: Worsening low back pain, prior lumbar spine surgery     Multisequence multiplanar MRI of the lumbar spine was performed on a spine coil without IV contrast administration.     Minimal posterior L1-L2 disc bulging is present.  L2-L3 and L3-L4 discs have a normal configuration.  L4-5 disc space height is reduced with small central posterior disc protrusion/extrusion with slight caudal extension.  Short pedicles and degenerative changes of L4-L5 apophyseal joints are present.  There is mild L4-L5 spinal stenosis with normal diameter neural foramina.  L5-S1 disc space height is moderately reduced with endplate irregularity and mild circumferential disc bulge/osteophytic spurring extending into the neural foramina bilaterally.  There is mild left L5-S1 neural foraminal stenosis.  Conus medullaris is normal in configuration with normal signal.  Lumbar vertebrae appear intact with normal alignment.  There are degenerative marrow signal changes at L5-S1.     Impression  1. Small central posterior L4-L5 disc protrusion/extrusion with slight caudal extension.  2. Short pedicles and degenerative changes L4-L5 apophyseal joints with mild spinal stenosis.  3. Chronic degenerative changes L5-S1 disc with mild circumferential disc bulge/osteophytic spurring.  4. Mild left L5-S1 neural foraminal stenosis.        Finalized by Arlyss Ruth, M.D. on 10/30/2023 1:58 PM. Dictated by Arlyss Ruth, M.D. on 10/30/2023 1:41 PM         IMPRESSION:  1. Lumbar radiculopathy    2. Cervical radiculopathy          PLAN:  53 year old with multi factorial pain here for follow up.  Her neck and arm pain is improved after the C7-T1 CESI.  What bothers her the most now is the low back and left leg pain.  On review of her MRI, she has some L4-5 central stenosis and some NF stenosis worst at L5-S1 and some at L4-5.  Given ongoing issues, we discussed trying a left L4-5, L5-S1 TFESI at next available which she is interested in.  Will have her continue with HEP for now and if neck and arm pain returns, could consider C7-T1 CESI in future when needed.     LEFT L4-5, L5-S1 TFESI  Consider C7-T1 CESI in future when needed.         Lonni CHRISTELLA Fox, MD  Department of Anesthesiology and Pain Medicine  [facility]  Available on Voalte         [1]   Current Outpatient Medications:     ADVAIR HFA 230-21 mcg/actuation inhaler, Inhale  by mouth into the lungs as Needed., Disp: , Rfl:     ALPRAZolam (XANAX) 0.25 mg tablet, Take one tablet by mouth. Once every other week., Disp: , Rfl:     EPINEPHrine  (EPIPEN  2-PAK) 1 mg/mL injection pen (2-Pack), Inject 0.3 mg (1 Pen) into thigh if needed for anaphylactic reaction. May repeat in 5-15 minutes if needed., Disp: 2 each, Rfl: 0    estradioL (ESTRACE) 1 mg tablet, Take one-half tablet by mouth daily., Disp: , Rfl:     ezetimibe (ZETIA) 10 mg tablet, Take one tablet by mouth daily., Disp: , Rfl:     famotidine  (PEPCID ) 40 mg tablet, TAKE ONE TABLET BY MOUTH EVERY DAY 30 MINUTES before dinner, Disp: 90 tablet, Rfl: 0    levothyroxine (SYNTHROID) 75 mcg tablet, Take one tablet by mouth daily 30 minutes before breakfast., Disp: , Rfl:     methocarbamoL (ROBAXIN) 750 mg tablet, Take one tablet by mouth at bedtime as needed for Spasms., Disp: , Rfl:     montelukast (SINGULAIR) 10 mg tablet, Take one tablet by mouth daily., Disp: , Rfl:     omeprazole  DR (PRILOSEC) 40 mg capsule, TAKE ONE CAPSULE BY MOUTH EVERY DAY before breakfast, TAKE 30 MINUTES before a meal, Disp: 90 capsule, Rfl: 3  [2]   Allergies  Allergen Reactions    Gluten DIARRHEA, FLATULENCE, NAUSEA AND VOMITING, SEE COMMENTS, RASH and STOMACH UPSET     Digestive problems and rash      Almond UNKNOWN    Asa [Aspirin] SEE COMMENTS     Nose bleeds      Egg UNKNOWN

## 2023-12-27 NOTE — Progress Notes
 SPINE CENTER  INTERVENTIONAL PAIN PROCEDURE HISTORY AND PHYSICAL    Cc:  pain    HISTORY OF PRESENT ILLNESS:  Patient presents today with pain and is here for injection.  Patient denies any new medical conditions or medications.  Patient states they have been off of anticoagulants and antiplatelets as instructed during clinic visit.  Patient denies any recent illnesses, infections, and is off of antibiotics     Past Medical History:    Anxiety disorder    Blurry vision    Chronic cough    Constipation    Dizziness    Fibromyalgia    Generalized headaches    GERD (gastroesophageal reflux disease)    Gluten intolerance    Headache    High cholesterol    Hypersensitivity pneumonitis (CMS-HCC)    Hypothyroidism    Infection    Lung disease    Other dysphagia    PONV (postoperative nausea and vomiting)    Seasonal allergic reaction    Unspecified deficiency anemia    Vision decreased       Surgical History:   Procedure Laterality Date    LUNG BIOPSY Right 02/03/2009    TENOTOMY Left 02/08/2016    arm-elbow    ESOPHAGOGASTRODUODENOSCOPY WITH SPECIMEN COLLECTION BY BRUSHING/ WASHING N/A 06/21/2021    Performed by Bonnetta Camellia BROCKS, MD at [facility] OR    ESOPHAGOGASTRODUODENOSCOPY WITH BIOPSY - FLEXIBLE N/A 06/21/2021    Performed by Bonnetta Camellia BROCKS, MD at [facility] OR    HX ARTHROSCOPIC SURGERY  07/28/2023    Hysterectomy    HX BACK SURGERY      HX HYSTERECTOMY  07/28/2023    LAMINECTOMY  11/04/1998    SURGERY  11/04/1998    Lower lumbar lamenectomy    TILT TABLE STUDY         family history includes Diabetes in her father; Diabetes Type II in her father; Hearing Loss in her father; Thyroid Disease in her mother.    Social History     Socioeconomic History    Marital status: Married   Tobacco Use    Smoking status: Former     Current packs/day: 0.00     Average packs/day: 1 pack/day for 5.0 years (5.0 ttl pk-yrs)     Types: Cigarettes     Quit date: 09/03/1992     Years since quitting: 31.3    Smokeless tobacco: Never    Tobacco comments:     Quit smoking 53yrs ago   Substance and Sexual Activity    Alcohol use: Yes     Alcohol/week: 18.0 standard drinks of alcohol     Types: 4 Glasses of wine, 10 Drinks containing 0.5 oz of alcohol, 4 Standard drinks or equivalent per week     Comment: weekends only    Drug use: Never    Sexual activity: Yes     Partners: Male     Birth control/protection: Surgical       Allergies[1]    Vitals:    12/29/23 1328 12/29/23 1405 12/29/23 1412   BP: 109/53 110/75 112/67   BP Source: Arm, Right Upper Arm, Right Upper Arm, Right Upper   Pulse: 66 68 66   Temp: 36.2 ?C (97.1 ?F)     SpO2: 100% 98% 99%   O2 Device: None (Room air) None (Room air) None (Room air)   Weight: 68 kg (150 lb)     Height: 165.1 cm (5' 5)  Pre procedural pain:  5  Post procedural pain:  0    REVIEW OF SYSTEMS: 14 point ROS obtained and negative except for those noted in HPI.      PHYSICAL EXAM:  General: Alert, cooperative, no distress  Head: Normocephalic, atraumatic  Eyes: Conjunctivae/corneas clear  Lungs: Unlabored respirations  Heart: Normal rate by palpation of pulse  Abdomen: Non-distended  Skin: Warm and dry to touch  Psychiatric: Mood and affect normal  Musculoskeletal: Moves all extremities  Neurological: Grossly intact.      IMPRESSION:    1. Lumbar radiculopathy          PLAN:   Plan to proceed with LEFT L4-5, L5-S1 TFESI.      Lonni CHRISTELLA Fox, MD  Department of Anesthesiology and Pain Medicine  [facility]  Available on Voalte             [1]   Allergies  Allergen Reactions    Gluten DIARRHEA, FLATULENCE, NAUSEA AND VOMITING, SEE COMMENTS, RASH and STOMACH UPSET     Digestive problems and rash      Almond UNKNOWN    Asa [Aspirin] SEE COMMENTS     Nose bleeds      Egg UNKNOWN

## 2023-12-29 ENCOUNTER — Encounter: Admit: 2023-12-29 | Discharge: 2023-12-29 | Payer: BLUE CROSS/BLUE SHIELD

## 2023-12-29 ENCOUNTER — Ambulatory Visit: Admit: 2023-12-29 | Discharge: 2023-12-29 | Payer: BLUE CROSS/BLUE SHIELD

## 2023-12-29 DIAGNOSIS — M5416 Radiculopathy, lumbar region: Principal | ICD-10-CM

## 2023-12-29 MED ORDER — DEXAMETHASONE SODIUM PHOS (PF) 10 MG/ML IJ EPIDURAL SOLN
15 mg | Freq: Once | EPIDURAL | 0 refills | Status: CP
Start: 2023-12-29 — End: ?

## 2023-12-29 MED ORDER — IOHEXOL 300 MG IODINE/ML IV SOLN
3 mL | Freq: Once | 0 refills | Status: CP
Start: 2023-12-29 — End: ?

## 2023-12-29 MED ORDER — LIDOCAINE (PF) 10 MG/ML (1 %) IJ SOLN
5 mL | Freq: Once | INTRAMUSCULAR | 0 refills | Status: CP
Start: 2023-12-29 — End: ?

## 2023-12-29 NOTE — Procedures
 Transforaminal Lumbar/Sacral THERAPEUTIC  Procedure: transforaminal epidural    Laterality: left   on 12/29/2023 2:00 PM  Location: lumbar - L4-5 and L5-S1      Consent:   Consent obtained: written  Consent given by: patient  Risks discussed: nerve damage, infection, bleeding, allergic reaction, seizure, weakness and no change or worsening in pain  Alternatives discussed: no treatment  Discussed with patient the purpose of the treatment/procedure, other ways of treating my condition, including no treatment/ procedure and the risks and benefits of the alternatives. Patient has decided to proceed with treatment/procedure.        Universal Protocol:  Relevant documents: relevant documents present and verified  Test results: test results available and properly labeled  Imaging studies: imaging studies available  Required items: required blood products, implants, devices, and special equipment available  Site marked: the operative site was marked  Patient identity confirmed: Patient identify confirmed verbally with patient.        Time out: Immediately prior to procedure a time out was called to verify the correct patient, procedure, equipment, support staff and site/side marked as required      Procedures Details:   Indications: pain   Prep: chlorhexidine  Number of Levels: 2  Approach: left paramedian  Guidance: fluoroscopy  Injection procedure: Incremental injection, Negative aspiration for blood and Introduced needle  Amount Injected:   L4-5: 2mL  L5-S1: 2mL  Outcome: Pain unchanged  Comments: INTERVENTIONAL PAIN MANAGEMENT PROCEDURE REPORT    Lumbar Transforaminal Epidural Injection    Date of Service: 12/29/23    Procedure Title(s):    1. LEFT L4-5, L5-S1 transforaminal epidural injection   2. Intraoperative fluoroscopy     Attending:  Lonni CHRISTELLA Fox, MD    Anesthesia: Local    Indications: Danielle Garner is a 53 y.o. female with a diagnosis of lumbar radicular pain. The patient's history and physical exam were reviewed. The risks, benefits and alternatives to the procedure were discussed, and all questions were answered to the patient's satisfaction. The patient agreed to proceed, and written informed consent was obtained.     Procedure in Detail:    The patient was brought into the procedure room and placed in the prone position on the fluoroscopy table. Standard monitors were placed, and vital signs were observed throughout the procedure. The area of the lumbar spine was prepped with 2% chlorhexidine and draped in a sterile manner.     The L4 vertebral body was identified with AP fluoroscopy. An oblique view to the  left was obtained to better visualize the inferior junction of the pedicle and transverse process. The 6 o'clock position of the pedicle was marked and identified. The skin and subcutaneous tissues in the area were anesthetized with 1% lidocaine . A 25-gauge needle was directed toward the targeted point under fluoroscopy until bone was contacted. The needle was then walked inferiorly until the neural foramen was entered. A lateral fluoroscopic view was then used to place the needle tip at the posterior and cephalad position in the foramen.     The L5 vertebral body was identified with AP fluoroscopy. An oblique view to the  left was obtained to better visualize the inferior junction of the pedicle and transverse process. The 6 o'clock position of the pedicle was marked and identified. The skin and subcutaneous tissues in the area were anesthetized with 1% lidocaine . A 25-gauge needle was directed toward the targeted point under fluoroscopy until bone was contacted. The needle was then walked inferiorly  until the neural foramen was entered. A lateral fluoroscopic view was then used to place the needle tip at the posterior and cephalad position in the foramen.     Negative aspiration was confirmed, and 1ml of contrast was injected at each level. Appropriate neurograms were observed under live AP fluoroscopy with no noted vascular or intrathecal uptake. Then, after negative aspiration, a solution consisting of 1.5  mL of sterile saline and 7.5 mg decadron  was easily injected at each level. The needle was removed with a saline flush. The patient's back was cleaned and a bandage was placed over the needle insertion points.    The same procedure was performed on the opposite side? No    Disposition: The patient tolerated the procedure well, and there were no apparent complications. Vital signs remained stable througtout the procedure. The patient was taken to the recovery area where discharge instructions for the procedure were given.     Estimated Blood Loss: Minimal    Specimens: None    Complications: None      This patient's clinical history, exam, AND imaging support radiculopathy AND there is a significant impact on quality of life and function AND the pain has been present for at least 4 weeks AND they have failed to improve with noninvasive conservative care.

## 2023-12-29 NOTE — Discharge Instructions - Supplementary Instructions
 GENERAL POST PROCEDURE INSTRUCTIONS  Physician: _________________________________  Procedure Completed Today:  Joint Injection (hip, knee, shoulder)  Cervical Epidural Steroid Injection  Cervical Transforaminal Steroid Injection  Trigger Point Injection  Caudal Epidural Steroid Injection  Piriformis Injection  Pudendal Nerve Block  Other _____________________ Thoracic Epidural Steroid Injection  Lumbar Epidural Steroid Injection  Lumbar Transforaminal Steroid Injection  Facet Joint Injection  Celiac Nerve Block  Sacrococcygeal  Sacroiliac Joint Injection   Important information following your procedure today:  You may drive today     If you had sedation, you may NOT drive today  Rest at home for the next 6 hours.  You may then begin to resume your normal activities.  DO NOT drive any vehicle, operate any power tools, drink alcohol, make any major decisions, or sign any legal documents for the next 12 hours.  Pain relief may not be immediate. It is possible you may even experience an increase in pain during the first 24-48 hours followed by a gradual decrease of your pain.  Though the procedure is generally safe, and complications are rare, we do ask that you be aware of any of the following:  Any swelling, persistent redness, new bleeding or drainage from the site of the injection.  You should not experience a severe headache.  You should not run a fever over 101oF.  New onset of sharp, severe back and or neck pain.  New onset of upper or lower extremity numbness or weakness.  New difficulty controlling bowel or bladder function after injection.  New shortness of breath.  ** If any of these occur, please call to report this occurrence to the nurse of Dr. Shayne Alken at [phone]. If you are calling after 4:00 p.m. or on weekends or holidays, please call [phone] and ask to have the resident physician on call for the physician paged or go to your local emergency room.  You may experience soreness at the injection site. Ice can be applied at 20-minute intervals for the first 24 hours. The following day you may alternate ice with heat if you are experiencing muscle tightness, otherwise continue with ice. Ice works best at decreasing pain. Avoid application of direct heat, hot showers or hot tubs today.  Avoid strenuous activity today. You many resume your regular activities and exercise tomorrow.  Patients with diabetes may see an elevation in blood sugars for 7-10 days after the injection. It is important to pay close attention to your diet, check your blood sugars daily and report extreme elevations to the physician that manages your diabetes.  Patients taking daily blood thinners can resume their regular dose this evening.  It is important that you take all medications ordered by your pain physician. Taking medications as ordered is an important part of your pain care plan. If you cannot continue the medication plan, please notify the physician.    Possible side effects to steroids that may occur:  Flushing or redness of the face  Irritability  Fluid retention  Change in women's menses  Minor headache    If you are unable to keep your upcoming appointment, please notify the [facility] scheduler at [phone] at least 24 hours in advance. If you have questions for the surgery center, call [facility] at [phone].

## 2024-01-06 ENCOUNTER — Encounter: Admit: 2024-01-06 | Discharge: 2024-01-06 | Payer: BLUE CROSS/BLUE SHIELD

## 2024-01-07 ENCOUNTER — Encounter: Admit: 2024-01-07 | Discharge: 2024-01-07 | Payer: BLUE CROSS/BLUE SHIELD

## 2024-01-07 DIAGNOSIS — M5412 Radiculopathy, cervical region: Secondary | ICD-10-CM

## 2024-01-07 DIAGNOSIS — M5416 Radiculopathy, lumbar region: Principal | ICD-10-CM

## 2024-01-07 DIAGNOSIS — M47816 Spondylosis without myelopathy or radiculopathy, lumbar region: Secondary | ICD-10-CM

## 2024-01-07 DIAGNOSIS — M7918 Myalgia, other site: Secondary | ICD-10-CM

## 2024-01-09 ENCOUNTER — Encounter: Admit: 2024-01-09 | Discharge: 2024-01-09 | Payer: BLUE CROSS/BLUE SHIELD

## 2024-01-09 ENCOUNTER — Ambulatory Visit: Admit: 2024-01-09 | Discharge: 2024-01-10 | Payer: BLUE CROSS/BLUE SHIELD

## 2024-01-09 MED ORDER — GABAPENTIN 300 MG PO CAP
ORAL_CAPSULE | ORAL | 3 refills | 30.00000 days | Status: AC
Start: 2024-01-09 — End: ?

## 2024-01-09 NOTE — Progress Notes
 Subjective:       History of Present Illness  Danielle Garner is a 53 y.o. female who is patient of Dr. Karolynn that Garner today for follow up of autonomic dysfunction and back pain. Please see below for any updates:     Danielle Garner today to discuss her MRI findings. She continues to follow up with Dr. Lizette at the pain clinic. She has done multiple epidurals. She gets relief for about a week and the pain returns. Within 2 weeks she is back to where she returned. Dr. Lizette wanted her to repeat PT.     She feels like she is not getting full range in her right arm. She is very active. She enjoys to golf and playing with her grandkids. She has fears that she will decline and not be able to do these things. She has tingling in her right hand. Denies dropping any items. She denies any falls.     She is on Robaxin and she states she is unsure benefit but does not like to miss doses just incase it is working.     She has a little dizziness but thinks this is related to her low blood pressure occasionally. Overall, these symptoms have been more stable.     She has had more headaches. She has about daily headaches this week alone. She reports 8-10 headaches a month. They are on the top of the head, occipital region and can spread to her ears.     She would like to manage her pain a little bit better.       MRI C spine wo contrast 10/30/23: 1. Degenerative changes of cervical spine. 2. 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 3. Retrolisthesis C5 on C6 with moderate spinal stenosis, severe right and  moderate left neural foraminal stenosis. 4. Mild broad right posterior C6-C7 osteophytic spurring/disc bulging with right lateral recess stenosis and severe right neural foraminal stenosis.   MRI L Spine wo contrast 10/30/23: 1. Small central posterior L4-L5 disc protrusion/extrusion with slight caudal extension. 2. Short pedicles and degenerative changes L4-L5 apophyseal joints with mild spinal stenosis. 3. Chronic degenerative changes L5-S1 disc with mild circumferential disc bulge/osteophytic spurring. 4. Mild left L5-S1 neural foraminal stenosis.     Last Clinical Summary 10/09/23:  Today she Garner with her husband. She states that things have been going well. A couple of months ago she has had a dizzy spell and has a feeling like she is going to black out, but it goes away with time. She was having these spells daily and now she is having 2 spells in the last 2 weeks so she has had some improvement. During this time she will have head pressure. Episodes last for a couple of minutes. She has constant TMJ pain. She has tried 2 onabotulinumtoxin A treatments, she did her last round last Friday.     She feels like she continues to have brain fog but the short term memory does not seem as bad. She said she wont be able to remember simple things such as what she had for dinner last night. But she does feel like these symptoms have improved.     She is on Baclofen  5mg  TID --> this has not been helpful, and it made her drowsy. She stopped this.     She has completed PT and got benefit of her range of motion.     Her muscle tension is her biggest concern. It  is mainly in her neck and shoulders.    She states her back is always tight and she has tightness in her forearms. She does state she types a lot at work and she states she knows that it contributes.     She has constant neck pain.     She continues to have chronic constipation. Followed up with GI previously about this.    She has tried Midodrine  in the past but did not feel well on this. She quit taking this.       Last Clinical Summary 02/26/23:  In the interim, she has noticed worsening muscle tension in her neck and upper back. This is a relatively diffuse in its involvement. She is taking robaxin 750 mg once daily. She has jaw tightness that she has received onabotulinumtoxinA for TMJ that did not help. Cyclobenzaprine in the past did not help. She is having issues with brain fog and short term memory issues. This is intermittent and not everyday. She has noticed intermittent balance issues in the mornings. She has noticed a poor sense of smell.     She denies significant issues with dizziness.     Clinical summary at last visit 07/05/22:   Since the cessation of spironolactone and implementing lifestyle changes, she reports no issues with dizziness episodes. We discussed the cause of this remains undetermined at this time. She denies heat intolerance, abnormal sweating, tremor, gait issues, muscle stiffness, mood changes, palpitations, fatigue, poor sense of smell, neuropathy, GI upset, dry eyes or eye mouth, or vision changes. She does have longstanding issues with constipation but she feels this is manageable at this time. We agreed to proceed with clinical monitoring for the time being.     Clinical summary at last visit 01/02/22:   Interval tilt table testing confirmed autonomic dysfunction (sympathoadrenal dysfunction). There is no history of T2DM or neuropathy. She does complain of poor sense of smell and rarely acting out her dreams at night (rare). 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 She does not display any other parkinsonian symptoms at this time. We discussed completing skin biopsy and starting fluorinef for symptomatic management. She did not tolerate midodrine  so we will stop this.     Clinical summary at last visit 08/15/21:   Spells began insidiously about 1 year ago. These occur about 15x per month and consist of involuntarily holding her breath for 4-5 seconds followed by a mild-moderate bitemporal headache, vision blurring + tunnel vision and dizziness lasting 5-10 seconds without interruption in consciousness and immediately returns to baseline. This tends to occur while she is upright and exerting herself doing household chores. This does not occur in a lying position. Neuro exam is unremarkable today. MRI head in 04/2021 is normal.     We discussed my impression of breath holding likely causative of presyncope. We discussed optimization of fluids, salt intake, compression stockings, crossing legs/tensing muscle prior to position changes. Will obtain echo, CTA head and neck to look for vertebrobasilar insufficiency. We also discuss treatment options for breath holding. I asked that she also speak to her Pulmonologist about these symptoms. Neurologic diagnostic considerations include motor tics and autonomic dysfunction. Will order autonomic testing and tilt table testing to investigate further. Consider trial of guanfacine or tetrabenazine.     HPI:   Spells + headache   Onset: 2022, insidious. She will hold her breath for 5 seconds or so (involuntary)     Location: bitemporal   Quality: pressure-like   Severity: 3-7/10  Total headache days per month: 15   Duration: 5 seconds   Associated symptoms: blurred vision, tunnel vision    Denies: lacrimation/rhinorrhea, positional changes, ringing in ears/roaring sounds   Aura: no   Triggers: occurs exclusively in the upright position, while doing chores around the house   Sleep/snoring: muscle tension, neck pan, grind her teeth, takes methocarbamol to help her sleep  Hx of head injury: no   Family hx of migraine: no  Last Eye exam: annual   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     She reports good oral intake. She does not add salt to her food. She denies LOC.     Relevant PMHx:  Anxiety/depression   Chronic headaches   Dizziness   Menopause on HRT   Chronic neck and muscle tension   Seasonal allergies   Chronic cough related silent reflux     Prior Workup:   [facility] MRI Head 05/04/21: normal  [place] Tilt table 10/2021: + of dysautonomia, sympathoadrenal dysfunction suggested and low cardiovagal function   [place] CTA head and neck 10/2021: no significant stenosis, degenerative spine changes __________________________________________________________________________________    Past Medical History:    Anxiety disorder    Blurry vision    Chronic cough    Constipation    Dizziness    Fibromyalgia    Generalized headaches    GERD (gastroesophageal reflux disease)    Gluten intolerance    Headache    High cholesterol    Hypersensitivity pneumonitis (CMS-HCC)    Hypothyroidism    Infection    Lung disease    Other dysphagia    PONV (postoperative nausea and vomiting)    Seasonal allergic reaction    Unspecified deficiency anemia    Vision decreased     Surgical History:   Procedure Laterality Date    LUNG BIOPSY Right 02/03/2009    TENOTOMY Left 02/08/2016    arm-elbow    ESOPHAGOGASTRODUODENOSCOPY WITH SPECIMEN COLLECTION BY BRUSHING/ WASHING N/A 06/21/2021    Performed by Bonnetta Camellia BROCKS, MD at [facility] OR    ESOPHAGOGASTRODUODENOSCOPY WITH BIOPSY - FLEXIBLE N/A 06/21/2021    Performed by Bonnetta Camellia BROCKS, MD at [facility] OR    HX ARTHROSCOPIC SURGERY  07/28/2023    Hysterectomy    HX BACK SURGERY      HX HYSTERECTOMY  07/28/2023    LAMINECTOMY  11/04/1998    SURGERY  11/04/1998    Lower lumbar lamenectomy    TILT TABLE STUDY       Social History     Tobacco Use    Smoking status: Former     Current packs/day: 0.00     Average packs/day: 1 pack/day for 5.0 years (5.0 ttl pk-yrs)     Types: Cigarettes     Quit date: 09/03/1992     Years since quitting: 31.3    Smokeless tobacco: Never    Tobacco comments:     Quit smoking 82yrs ago   Substance Use Topics    Alcohol use: Yes     Alcohol/week: 18.0 standard drinks of alcohol     Types: 4 Glasses of wine, 10 Drinks containing 0.5 oz of alcohol, 4 Standard drinks or equivalent per week     Comment: weekends only    Drug use: Never     Family History   Problem Relation Name Age of Onset  Diabetes Type II Father [place]     Diabetes Father [place]         Diagnosed 2022    Hearing Loss Father [place]     Thyroid Disease Mother Angeline      Allergies   Allergen Reactions    Gluten DIARRHEA, FLATULENCE, NAUSEA AND VOMITING, SEE COMMENTS, RASH and STOMACH UPSET     Digestive problems and rash      Almond UNKNOWN    Asa [Aspirin] SEE COMMENTS     Nose bleeds      Egg UNKNOWN     Review of Systems  ROS negative unless otherwise specified in HPI    Objective:          ADVAIR HFA 230-21 mcg/actuation inhaler Inhale  by mouth into the lungs as Needed.    ALPRAZolam (XANAX) 0.25 mg tablet Take one tablet by mouth. Once every other week.    EPINEPHrine  (EPIPEN  2-PAK) 1 mg/mL injection pen (2-Pack) Inject 0.3 mg (1 Pen) into thigh if needed for anaphylactic reaction. May repeat in 5-15 minutes if needed.    estradioL (ESTRACE) 1 mg tablet Take one-half tablet by mouth daily.    ezetimibe (ZETIA) 10 mg tablet Take one tablet by mouth daily.    famotidine  (PEPCID ) 40 mg tablet TAKE ONE TABLET BY MOUTH EVERY DAY 30 MINUTES before dinner    gabapentin  (NEURONTIN ) 300 mg capsule Take 300mg  at bedtime x 1 week, Take 300mg  in the AM and at bedtime x 1 week, Take 300mg  in the AM, in the evening, and at bedtime continue on this dose.    levothyroxine (SYNTHROID) 75 mcg tablet Take one tablet by mouth daily 30 minutes before breakfast.    methocarbamoL (ROBAXIN) 750 mg tablet Take one tablet by mouth at bedtime as needed for Spasms.    montelukast (SINGULAIR) 10 mg tablet Take one tablet by mouth daily.    omeprazole  DR (PRILOSEC) 40 mg capsule TAKE ONE CAPSULE BY MOUTH EVERY DAY before breakfast, TAKE 30 MINUTES before a meal     Vitals:    01/09/24 0913   BP: 110/68   BP Source: Arm, Left Upper   Pulse: 66   SpO2: 98%   PainSc: Three       There is no height or weight on file to calculate BMI.     General: alert, oriented x 3  Speech: normal, no dysarthria  Ext: no leg edema  Cranial nerves: II Visual fields full to finger counting; III, IV, VI PERRL, extraocular muscles intact, no nystagmus; V facial sensation intact; VII facial expression symmetric; VIII hearing intact to conversation;  Motor: (Right/Left) Deltoid 5/5, Biceps 5/5, Triceps 5/5, Finger ext 5/5, interossei 5/5, Hip Flexion 5/5, Knee ext 5/5, Knee flex 5/5, Ankle dorsiflexion 5/5, Ankle plantarflexion 5/5  Sensory: Normal to light touch.   Coordination: normal finger nose finger, rapid alternating movements and finger tapping speed normal, and heel to shin smooth without ataxia  Reflexes: (Right/Left) Biceps 2/2, Triceps 2/2, Brachioradialis 2/2, Knee Jerks 2/2, Ankle Jerks 2/2,   Abnormal Movements: no tremors, no rigidity, no bradykinesia  Gait: normal based, good arm swing    ASSESSMENT/PLAN:  CLINICAL SUMMARY      KARELI HOSSAIN 53 y.o. female with notable history of hypersensitivity pneumonitis, chronic allergies, menopause off of HTR here today for follow up on back pain. Autonomic symptoms have improved.       Impression:  1. Autonomic dysfunction   2. Severe cervical stenosis  RECOMMENDATIONS  Referral to NSG for severe stenosis  -- Trial of gabapentin . This is considered off label for your condition, but it is often found to be helpful.   Gabapentin   AM PM Bedtime  Week 1    300mg   Week 2  300mg   300mg   Week 3  300mg  300mg  300mg   Common side effects include drowsiness and dizziness. Do not drive until you know how this medication will effect you. Do not mix with alcohol.    MANAGEMENT AND PLAN  During the visit I discussed my impression, recommended diagnostic studies, prognosis, risks and benefits of management, instructions for management, and importance of compliance.  After a discussion, the patient agrees with the plan.     FOLLOWUP PLAN  Return in about 3 months (around 04/09/2024) for In-Person, Telehealth.   The patient is instructed to contact me if there are any concerns with the agreed plan.    Total of 30 minutes were spent on the same day of the visit including preparing to see the patient, obtaining and/or reviewing separately obtained history, performing a medically appropriate examination and/or evaluation, counseling and educating the patient/family/caregiver, ordering medications, tests, or procedures, referring and communication with other health care professionals, documenting clinical information in the electronic or other health record independently interpreting results and communicating results to the patient/family/caregiver, and care coordination.

## 2024-01-10 DIAGNOSIS — M4802 Spinal stenosis, cervical region: Principal | ICD-10-CM

## 2024-01-15 ENCOUNTER — Encounter: Admit: 2024-01-15 | Discharge: 2024-01-15 | Payer: BLUE CROSS/BLUE SHIELD

## 2024-01-16 ENCOUNTER — Encounter: Admit: 2024-01-16 | Discharge: 2024-01-16 | Payer: BLUE CROSS/BLUE SHIELD

## 2024-01-16 DIAGNOSIS — M489 Spondylopathy, unspecified: Principal | ICD-10-CM

## 2024-01-20 ENCOUNTER — Encounter: Admit: 2024-01-20 | Discharge: 2024-01-20 | Payer: BLUE CROSS/BLUE SHIELD

## 2024-01-20 ENCOUNTER — Ambulatory Visit: Admit: 2024-01-20 | Discharge: 2024-01-20 | Payer: BLUE CROSS/BLUE SHIELD

## 2024-01-20 DIAGNOSIS — M5030 Other cervical disc degeneration, unspecified cervical region: Secondary | ICD-10-CM

## 2024-01-20 DIAGNOSIS — M542 Cervicalgia: Secondary | ICD-10-CM

## 2024-01-20 DIAGNOSIS — M5416 Radiculopathy, lumbar region: Secondary | ICD-10-CM

## 2024-01-20 DIAGNOSIS — M545 Chronic bilateral low back pain, unspecified whether sciatica present: Secondary | ICD-10-CM

## 2024-01-20 DIAGNOSIS — M4802 Spinal stenosis, cervical region: Secondary | ICD-10-CM

## 2024-01-20 DIAGNOSIS — M48061 Spinal stenosis, lumbar region without neurogenic claudication: Secondary | ICD-10-CM

## 2024-01-20 DIAGNOSIS — M5412 Radiculopathy, cervical region: Secondary | ICD-10-CM

## 2024-01-20 DIAGNOSIS — M51372 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 back pain and lower extremity pain: Secondary | ICD-10-CM

## 2024-01-21 ENCOUNTER — Encounter: Admit: 2024-01-21 | Discharge: 2024-01-21 | Payer: BLUE CROSS/BLUE SHIELD

## 2024-01-27 ENCOUNTER — Encounter: Admit: 2024-01-27 | Discharge: 2024-01-27 | Payer: BLUE CROSS/BLUE SHIELD

## 2024-01-27 DIAGNOSIS — M5412 Radiculopathy, cervical region: Secondary | ICD-10-CM

## 2024-01-27 DIAGNOSIS — M5030 Other cervical disc degeneration, unspecified cervical region: Secondary | ICD-10-CM

## 2024-01-27 DIAGNOSIS — M4802 Spinal stenosis, cervical region: Secondary | ICD-10-CM

## 2024-01-27 DIAGNOSIS — M542 Cervicalgia: Secondary | ICD-10-CM

## 2024-01-28 ENCOUNTER — Encounter: Admit: 2024-01-28 | Discharge: 2024-01-28 | Payer: BLUE CROSS/BLUE SHIELD

## 2024-02-03 ENCOUNTER — Ambulatory Visit: Admit: 2024-02-03 | Discharge: 2024-02-04 | Payer: BLUE CROSS/BLUE SHIELD

## 2024-02-03 ENCOUNTER — Ambulatory Visit: Admit: 2024-02-03 | Discharge: 2024-02-03 | Payer: BLUE CROSS/BLUE SHIELD

## 2024-02-03 ENCOUNTER — Encounter: Admit: 2024-02-03 | Discharge: 2024-02-03 | Payer: BLUE CROSS/BLUE SHIELD

## 2024-02-03 DIAGNOSIS — M50121 Cervical disc disorder at C4-C5 level with radiculopathy: Principal | ICD-10-CM

## 2024-02-11 ENCOUNTER — Encounter: Admit: 2024-02-11 | Discharge: 2024-02-11 | Payer: BLUE CROSS/BLUE SHIELD

## 2024-02-16 ENCOUNTER — Encounter: Admit: 2024-02-16 | Discharge: 2024-02-16 | Payer: BLUE CROSS/BLUE SHIELD

## 2024-02-16 LAB — TYPE & CROSSMATCH
~~LOC~~ BKR ANTIBODY SCREEN: NEGATIVE
~~LOC~~ BKR UNITS ORDERED: 0

## 2024-02-16 LAB — CONFIRMATION BLOOD TYPE

## 2024-02-16 MED ORDER — OXYCODONE 5 MG PO TAB
10 mg | ORAL | 0 refills | Status: DC | PRN
Start: 2024-02-16 — End: 2024-02-17
  Administered 2024-02-16 – 2024-02-17 (×2): 10 mg via ORAL

## 2024-02-16 MED ORDER — ONDANSETRON HCL (PF) 4 MG/2 ML IJ SOLN
INTRAVENOUS | 0 refills | Status: DC
Start: 2024-02-16 — End: 2024-02-16

## 2024-02-16 MED ORDER — ACETAMINOPHEN 500 MG PO TAB
1000 mg | Freq: Once | ORAL | 0 refills | Status: DC | PRN
Start: 2024-02-16 — End: 2024-02-16

## 2024-02-16 MED ORDER — FENTANYL CITRATE (PF) 50 MCG/ML IJ SOLN
25 ug | INTRAVENOUS | 0 refills | Status: DC | PRN
Start: 2024-02-16 — End: 2024-02-16

## 2024-02-16 MED ORDER — PANTOPRAZOLE 40 MG PO TBEC
40 mg | Freq: Every day | ORAL | 0 refills | Status: DC
Start: 2024-02-16 — End: 2024-02-17
  Administered 2024-02-17: 02:00:00 40 mg via ORAL

## 2024-02-16 MED ORDER — DIPHENHYDRAMINE HCL 25 MG PO CAP
25 mg | ORAL | 0 refills | Status: DC | PRN
Start: 2024-02-16 — End: 2024-02-17

## 2024-02-16 MED ORDER — OXYCODONE 5 MG PO TAB
5 mg | ORAL | 0 refills | Status: DC | PRN
Start: 2024-02-16 — End: 2024-02-17
  Administered 2024-02-16: 18:00:00 5 mg via ORAL

## 2024-02-16 MED ORDER — PATCH DOCUMENTATION - SCOPOLAMINE BASE 1 MG/72HR
1 | Freq: Two times a day (BID) | TRANSDERMAL | 0 refills | Status: DC
Start: 2024-02-16 — End: 2024-02-17

## 2024-02-16 MED ORDER — MONTELUKAST 10 MG PO TAB
10 mg | Freq: Every evening | ORAL | 0 refills | Status: DC
Start: 2024-02-16 — End: 2024-02-17
  Administered 2024-02-17: 02:00:00 10 mg via ORAL

## 2024-02-16 MED ORDER — PROPOFOL 10 MG/ML IV EMUL 100 ML (INFUSION)(AM)(OR)
INTRAVENOUS | 0 refills | Status: DC
Start: 2024-02-16 — End: 2024-02-16
  Administered 2024-02-16 (×2): 150 ug/kg/min via INTRAVENOUS

## 2024-02-16 MED ORDER — POLYETHYLENE GLYCOL 3350 17 GRAM PO PWPK
1 | Freq: Two times a day (BID) | ORAL | 0 refills | Status: DC
Start: 2024-02-16 — End: 2024-02-17
  Administered 2024-02-17: 13:00:00 17 g via ORAL

## 2024-02-16 MED ORDER — HYDROMORPHONE (PF) 2 MG/ML IJ SYRG
.2 mg | INTRAVENOUS | 0 refills | Status: DC | PRN
Start: 2024-02-16 — End: 2024-02-16

## 2024-02-16 MED ORDER — DIPHENHYDRAMINE HCL 50 MG/ML IJ SOLN
25 mg | INTRAVENOUS | 0 refills | Status: DC | PRN
Start: 2024-02-16 — End: 2024-02-17

## 2024-02-16 MED ORDER — BUPIVACAINE HCL 0.25 % (2.5 MG/ML) IJ SOLN
0 refills | Status: DC
Start: 2024-02-16 — End: 2024-02-16

## 2024-02-16 MED ORDER — PHENYLEPHRINE IN 0.9% NACL (STD CONC) (PREMADE)(AM)(OR)
INTRAVENOUS | 0 refills | Status: DC
Start: 2024-02-16 — End: 2024-02-16
  Administered 2024-02-16: 13:00:00 .3 ug/kg/min via INTRAVENOUS

## 2024-02-16 MED ORDER — EZETIMIBE 10 MG PO TAB
10 mg | Freq: Every day | ORAL | 0 refills | Status: DC
Start: 2024-02-16 — End: 2024-02-17
  Administered 2024-02-16 – 2024-02-17 (×2): 10 mg via ORAL

## 2024-02-16 MED ORDER — ACETAMINOPHEN 500 MG PO TAB
1000 mg | ORAL | 0 refills | Status: DC
Start: 2024-02-16 — End: 2024-02-17
  Administered 2024-02-16 – 2024-02-17 (×5): 1000 mg via ORAL

## 2024-02-16 MED ORDER — ONDANSETRON HCL (PF) 4 MG/2 ML IJ SOLN
4 mg | INTRAVENOUS | 0 refills | Status: DC | PRN
Start: 2024-02-16 — End: 2024-02-17

## 2024-02-16 MED ORDER — CYCLOBENZAPRINE 10 MG PO TAB
10 mg | ORAL | 0 refills | Status: DC
Start: 2024-02-16 — End: 2024-02-17
  Administered 2024-02-16 – 2024-02-17 (×3): 10 mg via ORAL

## 2024-02-16 MED ORDER — DOCUSATE SODIUM 100 MG PO CAP
100 mg | Freq: Two times a day (BID) | ORAL | 0 refills | Status: DC
Start: 2024-02-16 — End: 2024-02-17
  Administered 2024-02-17 (×2): 100 mg via ORAL

## 2024-02-16 MED ORDER — SODIUM CHLORIDE 0.9% IV SOLP
INTRAVENOUS | 0 refills | Status: DC
Start: 2024-02-16 — End: 2024-02-17
  Administered 2024-02-16 – 2024-02-17 (×2): 1000.0000 mL via INTRAVENOUS

## 2024-02-16 MED ORDER — CEFAZOLIN 2 GRAM IV SOLR
2 g | Freq: Once | INTRAVENOUS | 0 refills | Status: CP
Start: 2024-02-16 — End: ?
  Administered 2024-02-16 (×2): 2 g via INTRAVENOUS

## 2024-02-16 MED ORDER — CEFAZOLIN 2 GRAM IV SOLR
2 g | INTRAVENOUS | 0 refills | Status: CP
Start: 2024-02-16 — End: ?
  Administered 2024-02-16 – 2024-02-17 (×2): 2 g via INTRAVENOUS

## 2024-02-16 MED ORDER — LEVOTHYROXINE 50 MCG PO TAB
100 ug | Freq: Every day | ORAL | 0 refills | Status: DC
Start: 2024-02-16 — End: 2024-02-17
  Administered 2024-02-17: 11:00:00 100 ug via ORAL

## 2024-02-16 MED ORDER — SCOPOLAMINE BASE 1 MG OVER 3 DAYS TD PT3D
1 | TRANSDERMAL | 0 refills | Status: DC
Start: 2024-02-16 — End: 2024-02-17
  Administered 2024-02-16 (×2): 1 via TRANSDERMAL

## 2024-02-16 MED ORDER — BACLOFEN 20 MG PO TAB
10 mg | Freq: Once | ORAL | 0 refills | Status: CP
Start: 2024-02-16 — End: ?
  Administered 2024-02-16: 11:00:00 10 mg via ORAL

## 2024-02-16 MED ORDER — ACETAMINOPHEN 1,000 MG/100 ML (10 MG/ML) IV SOLN
1000 mg | Freq: Once | INTRAVENOUS | 0 refills | Status: DC
Start: 2024-02-16 — End: 2024-02-16

## 2024-02-16 MED ORDER — HYDROMORPHONE (PF) 2 MG/ML IJ SYRG
INTRAVENOUS | 0 refills | Status: DC
Start: 2024-02-16 — End: 2024-02-16

## 2024-02-16 MED ORDER — ALBUMIN, HUMAN 5 % 250 ML IV SOLP (AN)(OSM)
INTRAVENOUS | 0 refills | Status: DC
Start: 2024-02-16 — End: 2024-02-16

## 2024-02-16 MED ORDER — SUCCINYLCHOLINE CHLORIDE 20 MG/ML IJ SOLN
INTRAVENOUS | 0 refills | Status: DC
Start: 2024-02-16 — End: 2024-02-16

## 2024-02-16 MED ORDER — SODIUM CHLORIDE 0.9% IV BOLUS
1000 mL | Freq: Once | INTRAVENOUS | 0 refills | Status: CP
Start: 2024-02-16 — End: ?
  Administered 2024-02-16: 16:00:00 1000.0000 mL via INTRAVENOUS
  Administered 2024-02-16: 11:00:00 1000 mL via INTRAVENOUS

## 2024-02-16 MED ORDER — GABAPENTIN 300 MG PO CAP
300 mg | Freq: Two times a day (BID) | ORAL | 0 refills | Status: DC
Start: 2024-02-16 — End: 2024-02-17
  Administered 2024-02-17 (×2): 300 mg via ORAL

## 2024-02-16 MED ORDER — LIDOCAINE (PF) 200 MG/10 ML (2 %) IJ SYRG
INTRAVENOUS | 0 refills | Status: DC
Start: 2024-02-16 — End: 2024-02-16

## 2024-02-16 MED ORDER — ACETAMINOPHEN 1,000 MG/100 ML (10 MG/ML) IV SOLN
1000 mg | Freq: Once | INTRAVENOUS | 0 refills | Status: DC | PRN
Start: 2024-02-16 — End: 2024-02-16

## 2024-02-16 MED ORDER — CYCLOBENZAPRINE 10 MG PO TAB
10 mg | ORAL | 0 refills | Status: DC | PRN
Start: 2024-02-16 — End: 2024-02-17

## 2024-02-16 MED ORDER — FENTANYL CITRATE (PF) 50 MCG/ML IJ SOLN
50 ug | INTRAVENOUS | 0 refills | Status: DC | PRN
Start: 2024-02-16 — End: 2024-02-16

## 2024-02-16 MED ORDER — PROPOFOL INJ 10 MG/ML IV VIAL
INTRAVENOUS | 0 refills | Status: DC
Start: 2024-02-16 — End: 2024-02-16

## 2024-02-16 MED ORDER — EPHEDRINE SULFATE 50 MG/ML IV SOLN
INTRAVENOUS | 0 refills | Status: DC
Start: 2024-02-16 — End: 2024-02-16

## 2024-02-16 MED ORDER — DIPHENHYDRAMINE HCL 50 MG/ML IJ SOLN
25 mg | Freq: Once | INTRAVENOUS | 0 refills | Status: DC | PRN
Start: 2024-02-16 — End: 2024-02-16

## 2024-02-16 MED ORDER — ACETAMINOPHEN 500 MG PO TAB
1000 mg | Freq: Once | ORAL | 0 refills | Status: CP
Start: 2024-02-16 — End: ?
  Administered 2024-02-16: 11:00:00 1000 mg via ORAL

## 2024-02-16 MED ORDER — LIDOCAINE-EPINEPHRINE 1 %-1:100,000 IJ SOLN
0 refills | Status: DC
Start: 2024-02-16 — End: 2024-02-16

## 2024-02-16 MED ORDER — VENLAFAXINE 75 MG PO CP24
75 mg | Freq: Every day | ORAL | 0 refills | Status: DC
Start: 2024-02-16 — End: 2024-02-17
  Administered 2024-02-17: 13:00:00 75 mg via ORAL

## 2024-02-16 MED ORDER — THROMBIN (BOVINE) 5,000 UNIT TP SPSY
0 refills | Status: DC
Start: 2024-02-16 — End: 2024-02-16

## 2024-02-16 MED ORDER — HALOPERIDOL LACTATE 5 MG/ML IJ SOLN
1 mg | Freq: Once | INTRAVENOUS | 0 refills | Status: DC | PRN
Start: 2024-02-16 — End: 2024-02-16

## 2024-02-16 MED ORDER — REMIFENTANYL 1000MCG IN NS 20ML (OR)
INTRAVENOUS | 0 refills | Status: DC
Start: 2024-02-16 — End: 2024-02-16
  Administered 2024-02-16 (×2): .08 ug/kg/min via INTRAVENOUS

## 2024-02-16 MED ORDER — BISACODYL 10 MG RE SUPP
10 mg | Freq: Every day | RECTAL | 0 refills | Status: DC | PRN
Start: 2024-02-16 — End: 2024-02-17

## 2024-02-16 MED ORDER — FENTANYL CITRATE (PF) 50 MCG/ML IJ SOLN
INTRAVENOUS | 0 refills | Status: DC
Start: 2024-02-16 — End: 2024-02-16

## 2024-02-16 MED ORDER — ARTIFICIAL TEARS (PF) SINGLE DOSE DROPS GROUP
OPHTHALMIC | 0 refills | Status: DC
Start: 2024-02-16 — End: 2024-02-16

## 2024-02-16 MED ORDER — PREGABALIN 25 MG PO CAP
100 mg | Freq: Once | ORAL | 0 refills | Status: CP
Start: 2024-02-16 — End: ?
  Administered 2024-02-16: 11:00:00 100 mg via ORAL

## 2024-02-16 MED ORDER — OXYCODONE 5 MG PO TAB
5 mg | ORAL | 0 refills | Status: DC | PRN
Start: 2024-02-16 — End: 2024-02-16

## 2024-02-16 MED ORDER — MAGNESIUM HYDROXIDE 400 MG/5 ML PO SUSP
30 mL | Freq: Every day | ORAL | 0 refills | Status: DC
Start: 2024-02-16 — End: 2024-02-17
  Administered 2024-02-17: 13:00:00 30 mL via ORAL

## 2024-02-16 MED ORDER — DEXAMETHASONE SODIUM PHOSPHATE 4 MG/ML IJ SOLN
INTRAVENOUS | 0 refills | Status: DC
Start: 2024-02-16 — End: 2024-02-16

## 2024-02-16 MED ORDER — VANCOMYCIN 1,000 MG IV SOLR
0 refills | Status: DC
Start: 2024-02-16 — End: 2024-02-16

## 2024-02-16 MED ORDER — MIDAZOLAM 1 MG/ML IJ SOLN
INTRAVENOUS | 0 refills | Status: DC
Start: 2024-02-16 — End: 2024-02-16

## 2024-02-16 MED ADMIN — WATER FOR INJECTION, STERILE IJ SOLN [79513]: 20 mL | INTRAVENOUS | Stop: 2024-02-16 | NDC 00409488723

## 2024-02-16 NOTE — Unmapped
 Brief Operative Note    Name: Danielle Garner is a 53 y.o. 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]    Procedure(s) (LRB):  Cervical 4-Cervical 5, Cervical 5-Cervical 6, Cervical 6-Cervical 7  Anterior Cervical Discectomy & Fusion with neuromonitoring (Left)  ARTHRODESIS SPINE ANTERIOR INTERBODY WITH DISCECTOMY/ OSTEOPHYTECTOMY/ DECOMPRESSION - CERVICAL BELOW Cervical 2 - EACH ADDITIONAL INTERSPACE  INSERTION, INTERVERTEBRAL BIOMECHANICAL DEVICE, VERTEBRAL DEFECT OR INTERSPACE, ANTERIOR    Surgeons and Role:     DEWAINE Delmer Curtistine CHRISTELLA, MD - Primary     * de Audery Dempsey LABOR, DO - Resident - Assisting      Findings: C4-C7 ACDF     Estimated Blood Loss: No blood loss documented.     Specimen(s) Removed/Disposition: * No specimens in log *    Complications:  None      Implants:   Implant Name Serial No. Manufacturer Lot No. LRB No. Used Action   I-FACTOR Bone Graft Putty 2.5cc - [employer] [id number]  1 Implanted   SPACER HEDRON C 14x16 7?? 5mm - [employer] [id number]  1 Implanted   SPACER HEDRON C 14x16 7?? 6mm - [employer] [id number]  1 Implanted   SPACER HEDRON C 14x16 7?? 6mm - [employer] [id number]  1 Implanted   SCREW BONE 4.2MM RESONATE SPINE SELF DRILL VARIBLE - [employer] NA  8 Implanted   PLATE XLORDOTIC 3 LEVEL RESONATE BONE SPINE CERVICAL - [employer] NA  1 Implanted       Drains: Details on drains available on LDA report    Disposition:  PACU - stable    Dempsey Everitt Audery, DO  Pager [phone]

## 2024-02-16 NOTE — Operative Report(Direct Entry)
 OPERATIVE REPORT    Name: Danielle Garner is a 53 y.o. 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]    Post-op Diagnosis      * Spinal stenosis of cervical region [M48.02]     * Cervical radiculopathy [M54.12]     * Foraminal stenosis of cervical region [M48.02]     * Neck pain [M54.2]     * DDD (degenerative disc disease), 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]    Procedure(s) (LRB):  Cervical 4-Cervical 5, Cervical 5-Cervical 6, Cervical 6-Cervical 7  Anterior Cervical Discectomy & Fusion with neuromonitoring (Left)  ARTHRODESIS SPINE ANTERIOR INTERBODY WITH DISCECTOMY/ OSTEOPHYTECTOMY/ DECOMPRESSION - CERVICAL BELOW Cervical 2 - EACH ADDITIONAL INTERSPACE  INSERTION, INTERVERTEBRAL BIOMECHANICAL DEVICE, VERTEBRAL DEFECT OR INTERSPACE, ANTERIOR    C4-7 ACDF with anterior plating      Description and Findings of Operative Procedure: Indications for operative intervention: 53 year-old female with a history of cervical stenosis and radiculopathy refractory to conservative treatment options.  Patient presents for C4-7 anterior cervical discectomy and fusion.  The risks, complications and alternatives were discussed with the patient and agreed to proceed with surgical intervention.     Description and Findings of Operative Procedure:  The patient was anesthetized by the anesthesia service and positioned with an interscapular roll.  Baseline neuromonitoring was established with motor and sensory evoked potentials. All pressure points were padded.  Appropriate DVT prophylaxis was undertaken.  Using fluoroscopy the intended operative levels were confirmed and a carotid incision was marked.  The patient was prepped and draped in normal sterile fashion.  A formal timeout was performed.  Preoperative antibiotics were administered.  Local anesthetic was administered along the planned incision     The incision was carried through the platysma sharply.  The anterior cervical spine was exposed through a fascial dissection.  The proper levels were identified with intraoperative fluoroscopy.  The medial attachments of the longus colli were released bilaterally with electrocautery.  The exposure was maintained with the [facility] table-mounted retractor system.  Anterior osteophytes at each involved vertebra were removed with rongeurs.       The decompression ensued beginning at the C4-5 interspace as this was the level with the most severe compression.. The anterior longitudinal ligament and disc anulus were incised. The disc material and cartilaginous endplates were removed with curettes and rongeurs. The operating microscope was brought into the field for microdissection and illumination and utilized for the remainder of the case until closure.The burr was used to expose the interspace further and prepare the endplates for grafting.  The posterior longitudinal ligament was identified.   The posterior osteophytes were removed with a match-head bur.  The posterior longitudinal ligament was incised.  There is noted to be significant disc osteophyte complex causing severe compression with indentation of the ventral thecal sac.  Once an adequate decompression, both centrally and foraminal, was achieved attention was directed to the arthrodesis.  The disc space was trialed and appropriately sized graft was  selected.  A HEDRON C spacer 14 x 16 mm, 5 mm height, 7 degree lordosis was filled with iFACTOR graft and placed under fluoroscopic guidance.  Neuromonitoring stable during graft placement.    A similar decompression was performed at the C5-6 and C6-7 level.  This levels were decompressed in a manner similar to that at the other level. The anterior annulus, disc, and cartilaginous endplates were removed using the operating microscope for microdissection illumination.   A HEDRON C spacer 14 x 16 mm, 6 mm height, 7 degree lordosis was filled with iFACTOR bone graft and placed under fluoroscopic guidance into the C5-6 disc space and C6-7 disc space.  Neuromonitoring stable during graft placement.     A properly sized 45mm lordotic resonate plate was selected and attached with screws to the vertebral bodies of C4,C5, C6, C7. The placement of the plate was done under fluoroscopic guidance.  The locking mechanism of the plate was engaged.  The wound was copiously irrigated.  A medium Hemovac drain was placed over the plate and exited out a separate stab incision and secured to the skin with a 3-0 nylon suture.  The platysma was closed with interrupted 2-0 Vicryl suture.  The subdermal tissue was re approximated with interrupted 3-0 vicryl suture. The skin was sealed with Dermabond.  The drain was connected to a closed drainage system.  The patient awoke from anesthesia in stable neurological condition.  All sponge and needle counts were correct x2.  There were no complications.  I was present for the entire procedure     Estimated Blood Loss:  30cc    Specimen(s) Removed/Disposition: * No specimens in log *    Attestation: I performed this procedure with a resident.    Complications:  None      Implants:   Implant Name Serial No. Manufacturer Lot No. LRB No. Used Action   I-FACTOR Bone Graft Putty 2.5cc - [employer] [id number]  1 Implanted   SPACER HEDRON C 14x16 7?? 5mm - [employer] [id number]  1 Implanted   SPACER HEDRON C 14x16 7?? 6mm - [employer] [id number]  1 Implanted   SPACER HEDRON C 14x16 7?? 6mm - [employer] [id number]  1 Implanted   SCREW BONE 4.2MM RESONATE SPINE SELF DRILL VARIBLE - [employer] NA  8 Implanted PLATE XLORDOTIC 3 LEVEL RESONATE BONE SPINE CERVICAL - [employer] NA  1 Implanted       Drains: Details on drains available on LDA report    Disposition:  PACU - stable          Curtistine CHRISTELLA Corners, MD  Pager

## 2024-02-16 NOTE — Interval H&P Note
 H&P Interval Note    Name: Danielle Garner               FMW:[id number]     Date:02/16/2024               I have examined the patient, and there are no significant changes in their condition, from the previous H&P performed on 01/20/24.      Dempsey Everitt Devoid, DO  Pager [phone]

## 2024-02-16 NOTE — Procedures
 Neurology intra-Operating Monitoring report (IOM)    Date of procedure: 02/16/24    Diagnosis: M43.8X2  Procedure: Cervical 4-Cervical 5, Cervical 5-Cervical 6, Cervical 6-Cervical 7 Anterior Cervical Discectomy & Fusion with neuromonitoring (Left)   Surgeon: Delmer  Baseline acquisition time: 7:52 am  Monitoring start time: 7:58 am  Incision time: 8:20 am  Closing started at: 11:43 am  Monitoring stop time: 11:51 am    TcMEP: Trancranial Motor Evoked Potential:  Post induction baseline transcranial motor evoked potentials were recorded from bilateral trapezius, deltoid, biceps, triceps, abductor digiti minimi, tibialis anterior, vastus lateralis, and adductor hallucis muscles and were reproducible with normal latencies and morphology . The compound muscle action potential data showed no evidence to suggest change in the lateral cortico-spinal pathways as a consequence of this surgery.    SSEP: Somatosensory Evoked Potentials:  Post induction baseline somatosensory evoked potentials from bilateral ulnar and posterior tibial nerves were obtained and were reproducible with normal latencies and morphology. The data from neurophysiological monitoring of somatosensory evoked potentials showed no evidence to suggest change in the posterior column-medial lemniscus cortical pathways as a consequence of this surgery.    EMG: Electromyography:  EMG from  bilateral trapezius, deltoid, biceps, triceps, abductor digiti minimi, tibialis anterior, vastus lateralis, and adductor hallucis muscles were obtained and monitored throughout the procedure. There was no sustained baseline EMG activity. (Transient activity was observed in association with manipulation of nerves. Low level of activity was observed from side/muscle, Spike trains were observed in side/muscle)      Repetitive Nerve Stimulation:  TO4 (train of 4) was frequently run throughout the procedure to monitor for neuromuscular blockade. A nerve was repetitively stimulation and the compound motor action potentials (CMAPs) recorded from a distal motor group. TO4 was assessed during critical periods of surgery and demonstrated that neuromuscular blockade was not present while monitoring EMGs and TcMEP.      I remotely monitored and supervised this case in real-time, online via a secure HIPAA-compliant network.     Earla Cease, MD

## 2024-02-17 ENCOUNTER — Encounter: Admit: 2024-02-17 | Discharge: 2024-02-17 | Payer: BLUE CROSS/BLUE SHIELD

## 2024-02-17 ENCOUNTER — Inpatient Hospital Stay: Admit: 2024-02-17 | Discharge: 2024-02-17 | Payer: BLUE CROSS/BLUE SHIELD

## 2024-02-17 DIAGNOSIS — Z822 Family history of deafness and hearing loss: Secondary | ICD-10-CM

## 2024-02-17 DIAGNOSIS — Z87891 Personal history of nicotine dependence: Secondary | ICD-10-CM

## 2024-02-17 DIAGNOSIS — E039 Hypothyroidism, unspecified: Secondary | ICD-10-CM

## 2024-02-17 DIAGNOSIS — M48061 Spinal stenosis, lumbar region without neurogenic claudication: Secondary | ICD-10-CM

## 2024-02-17 DIAGNOSIS — M4802 Spinal stenosis, cervical region: Secondary | ICD-10-CM

## 2024-02-17 DIAGNOSIS — M5117 Intervertebral disc disorders with radiculopathy, lumbosacral region: Secondary | ICD-10-CM

## 2024-02-17 DIAGNOSIS — Z79890 Hormone replacement therapy (postmenopausal): Secondary | ICD-10-CM

## 2024-02-17 DIAGNOSIS — Z90710 Acquired absence of both cervix and uterus: Secondary | ICD-10-CM

## 2024-02-17 DIAGNOSIS — K3184 Gastroparesis: Secondary | ICD-10-CM

## 2024-02-17 DIAGNOSIS — Z833 Family history of diabetes mellitus: Secondary | ICD-10-CM

## 2024-02-17 DIAGNOSIS — Z8349 Family history of other endocrine, nutritional and metabolic diseases: Secondary | ICD-10-CM

## 2024-02-17 DIAGNOSIS — G8929 Other chronic pain: Secondary | ICD-10-CM

## 2024-02-17 DIAGNOSIS — M5412 Radiculopathy, cervical region: Principal | ICD-10-CM

## 2024-02-17 DIAGNOSIS — M5116 Intervertebral disc disorders with radiculopathy, lumbar region: Secondary | ICD-10-CM

## 2024-02-17 DIAGNOSIS — E7800 Pure hypercholesterolemia, unspecified: Secondary | ICD-10-CM

## 2024-02-17 DIAGNOSIS — M797 Fibromyalgia: Secondary | ICD-10-CM

## 2024-02-17 MED ORDER — HEPARIN, PORCINE (PF) 5,000 UNIT/0.5 ML IJ SYRG
5000 [IU] | SUBCUTANEOUS | 0 refills | Status: DC
Start: 2024-02-17 — End: 2024-02-17

## 2024-02-17 MED ORDER — METHOCARBAMOL 750 MG PO TAB
750 mg | ORAL_TABLET | ORAL | 0 refills | 10.00000 days | Status: AC | PRN
Start: 2024-02-17 — End: ?
  Filled 2024-02-17: qty 30, 10d supply, fill #0

## 2024-02-17 MED ORDER — LACTATED RINGERS IV BOLUS
1000 mL | Freq: Once | INTRAVENOUS | 0 refills | Status: CP
Start: 2024-02-17 — End: ?
  Administered 2024-02-17: 05:00:00 1000 mL via INTRAVENOUS

## 2024-02-17 MED ORDER — ACETAMINOPHEN 500 MG PO TAB
1000 mg | ORAL | 0 refills | 8.00000 days | Status: AC | PRN
Start: 2024-02-17 — End: ?

## 2024-02-17 MED ORDER — POLYETHYLENE GLYCOL 3350 17 GRAM PO PWPK
17 g | Freq: Two times a day (BID) | ORAL | 0 refills | 22.00000 days | Status: AC
Start: 2024-02-17 — End: ?

## 2024-02-17 MED ORDER — OXYCODONE 5 MG PO TAB
5 mg | ORAL_TABLET | ORAL | 0 refills | 6.00000 days | Status: AC | PRN
Start: 2024-02-17 — End: ?
  Filled 2024-02-17: qty 20, 4d supply, fill #0

## 2024-02-17 MED ORDER — METHOCARBAMOL 750 MG PO TAB
750 mg | ORAL | 0 refills | Status: DC
Start: 2024-02-17 — End: 2024-02-17
  Administered 2024-02-17: 15:00:00 750 mg via ORAL

## 2024-02-17 MED ORDER — OXYCODONE 5 MG PO TAB
5 mg | ORAL | 0 refills | Status: DC | PRN
Start: 2024-02-17 — End: 2024-02-17

## 2024-02-17 MED ADMIN — WATER FOR INJECTION, STERILE IJ SOLN [79513]: 20 mL | INTRAVENOUS | @ 08:00:00 | Stop: 2024-02-17 | NDC 00409488723

## 2024-02-17 NOTE — Care Plan
 CA7 END OF SHIFT/ JHFRAT NOTE    Admission Date: 02/16/2024    Acute events, interventions, provider communication: Notified Dr. Xenia Pouch about patient blood pressure 82/42. Fluid bolus added.     Patient Interventions and Education  Fall Risk/JHFRAT Interventions and Education: (Charting when applicable)  Elimination Interventions : Commode at bedside  Medications : Bedside commode (i.e., urgency, frequency, dizziness) , Use of gait belt , Stay within arm's reach during toileting/showering (i.e., dizziness, orthostasis) , Educate patient on medication side effects, and Bed/chair alarm (i.e., change in mental status)   Patient Care Equipment: Needs assistance with patient care equipment when ambulating, Ensure environment is free of clutter and walkways are clear from tripping hazards, and Assess need for patient equipment and remove if not in use  Mobility: Assist x1, Gait belt in use when ambulating, Use of additional staff for handling patient equipment, and Ensure the use of corrective lens and/or hearing aides are in place prior to ambulation   Cognition: N/A  Risk for Moderate/Major Injury: Surgery/procedure requiring anesthesia within past 24 hours    2. Restraints:  No     Restraint Goal: Patient will be free from injury while physically restrained.  See Docflowsheet for restraint documentation, interventions, education, etc.      Intake and Output:       Intake/Output Summary (Last 24 hours) at 02/17/2024 0037  Last data filed at 02/16/2024 2039  Gross per 24 hour   Intake 1930 ml   Output 490 ml   Net 1440 ml              Last Bowel Movement Date:  (PTA)    Problem: Glucose Management  Goal: Absence of hyperglycemia  Outcome: Goal Ongoing  Goal: Absence of Hypoglycemia  Outcome: Goal Ongoing  Goal: Glucose level within specified parameters  Outcome: Goal Ongoing     Problem: Moderate Fall Risk  Goal: Moderate Fall Risk  Outcome: Goal Ongoing

## 2024-02-17 NOTE — Discharge Instructions - Pharmacy
 Discharge Summary      Name: Danielle Garner  Medical Record Number: [id number]        Account Number:  [id number]  Date Of Birth:  1971/04/04                         Age:  53 y.o.  Admit date:  02/16/2024                     Discharge date: 02/17/2024      Discharge Attending:  Dr. 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]    Primary Discharge Diagnosis:   Cervical radiculopathy    Hospital Diagnoses:  Hospital Problems        Active Problems    * (Principal) Cervical radiculopathy    Spinal stenosis of cervical region    Foraminal stenosis of lumbar region    Foraminal stenosis of cervical region    Neck pain    Chronic bilateral low back pain    Lumbar radiculopathy    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   back pain and lower extremity pain    DDD (degenerative disc disease), cervical     Present on Admission:  **None**        Significant Past Medical History        Anxiety disorder  Blurry vision  Cervical radiculopathy  Chronic bilateral low back pain  Constipation      Comment:  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  DDD (degenerative disc disease), cervical  Degeneration of intervertebral disc of lumbosacral region with   discogenic back pain and lower extremity pain  Dizziness  Fibromyalgia  Foraminal stenosis of cervical region  Foraminal stenosis of lumbar region  Generalized headaches      Comment:  Under stress or with neck pain  GERD (gastroesophageal reflux disease)  Gluten intolerance  Headache  High cholesterol      Comment:  medication started in 2024  Hypersensitivity pneumonitis (CMS-HCC)  Hypothyroidism  Infection      Comment:  currently taking antibiotics  - 2nd round  Lumbar radiculopathy  Lung disease      Comment:  Hypersensativity Pnuemonia  Neck pain  Other dysphagia  PONV (postoperative nausea and vomiting)      Comment:  Scop Patch helpful  Seasonal allergic reaction  Spinal stenosis  Spinal stenosis of cervical region  Unspecified deficiency anemia  Vision decreased    Allergies   Gluten, Almond, Asa [aspirin], and Egg    Brief Hospital Course   The patient was admitted and the following issues were addressed during this hospitalization: (with pertinent details including admission exam/imaging/labs).      Patient was admitted on 02/16/24 and underwent C4-C7 Anterior Cervical Discectomy and Fusion. Patient was extubated and tolerated procedure well, patient awoke at her neurologic baseline. Patient was admitted to Neurosurgery service in stable condition. Post-operatively, patient's diet was advanced and was mobilizing at her baseline. Pain was controlled on PO pain regimen. Surgical drain output was monitored and drain was removed on 02/17/24. Patient was stable for discharge on 02/17/24 and discharged home with follow-up appointment with Dr. Delmer.     Items  Needing Follow Up   Pending items or areas that need to be addressed at follow up: n/a    Pending Labs and Follow Up Radiology    Pending labs and/or radiology review at this time of discharge are listed below: Please note- any labs with collected status will not have a result; if this area is blank, there are no items for review.       Medications      Medication List      START taking these medications     acetaminophen 500 mg tablet; Commonly known as: TYLENOL EXTRA STRENGTH;   Dose: 1,000 mg; Take two tablets by mouth every 6 hours as needed for   Pain. Max of 4,000 mg of acetaminophen in 24 hours.  Indications: pain;   For: pain; Refills: 0   oxyCODONE 5 mg tablet; Commonly known as: ROXICODONE; Dose: 5 mg; Take   one tablet by mouth every 4 hours as needed (pain not relieved by tylenol;   not to exceed one week of use). Indications: pain; For: pain; Quantity: 20   tablet; Refills: 0   polyethylene glycol 3350 17 g packet; Commonly known as: MIRALAX; Dose:   17 g; Take one packet by mouth twice daily. Indications: constipation;   For: constipation; Refills: 0     CHANGE how you take these medications     famotidine  40 mg tablet; Commonly known as: PEPCID ; TAKE ONE TABLET BY   MOUTH EVERY DAY 30 MINUTES before dinner; Quantity: 90 tablet; Refills: 0;   What changed: See the new instructions.   gabapentin  300 mg capsule; Commonly known as: NEURONTIN ; Take 300mg  at   bedtime x 1 week, Take 300mg  in the AM and at bedtime x 1 week, Take 300mg    in the AM, in the evening, and at bedtime continue on this dose.;   Quantity: 90 capsule; Refills: 3; What changed: how much to take, how to   take this, when to take this, additional instructions   methocarbamoL 750 mg tablet; Commonly known as: ROBAXIN; Dose: 750 mg;   Take one tablet by mouth every 8 hours as needed for Spasms. Indications:   muscle spasm; For: muscle spasm; Quantity: 30 tablet; Refills: 0; What   changed: when to take this, reasons to take this   omeprazole  DR 40 mg capsule; Commonly known as: PriLOSEC; TAKE ONE   CAPSULE BY MOUTH EVERY DAY before breakfast, TAKE 30 MINUTES before a   meal; Quantity: 90 capsule; Refills: 3; What changed: See the new   instructions.     CONTINUE taking these medications     ALPRAZolam 0.25 mg tablet; Commonly known as: XANAX; Dose: 0.25 mg;   Refills: 0   Black Cohosh 540 mg Cap; Dose: 1 capsule; Refills: 0   calcium carbonate-vitamin D3 1250 mg/200 unit tablet; Commonly known as:   OS-CAL 500 + D; Dose: 1 tablet; Refills: 0   desvenlafaxine succinate 50 mg tablet; Commonly known as: PRISTIQ; Dose:   50 mg; Refills: 0   docusate 100 mg capsule; Commonly known as: COLACE; Dose: 100 mg;   Refills: 0   EPINEPHrine  1 mg/mL injection pen (2-Pack); Commonly known as: EPIPEN    2-PAK; Inject 0.3 mg (1 Pen) into thigh if needed for anaphylactic reaction. May repeat in 5-15 minutes if needed.; Quantity: 2 each;   Refills: 0   estradioL 1 mg tablet; Commonly known as: ESTRACE; Dose: 0.5 mg;   Refills: 0   ezetimibe 10 mg tablet; Commonly known  as: ZETIA; Dose: 10 mg; Refills:   0   ferrous sulfate 325 mg (65 mg iron) tablet; Commonly known as: FEOSOL;   Dose: 325 mg; Refills: 0   GAVISCON EXTRA STRENGTH 160-105 mg chewable tablet; Generic drug:   aluminum hydrox-magnesium carb; Dose: 1 tablet; Refills: 0   lactobacillus rhamnosus GG 10 billion cell capsule; Commonly known as:   CULTURELLE; Dose: 1 capsule; Refills: 0   levothyroxine 100 mcg tablet; Commonly known as: SYNTHROID; Dose: 100   mcg; Refills: 0   montelukast 10 mg tablet; Commonly known as: SINGULAIR; Dose: 10 mg;   Refills: 0   multivitamin tablet; Commonly known as: ONE-A-DAY; Dose: 1 tablet;   Refills: 0       Return Appointments and Scheduled Appointments     Scheduled appointments:      Mar 02, 2024 2:30 PM  Postoperative visit with Josette LITTIE JINNY Ellery, PA-C  [facility]: [facility] ([facility] & [facility]) [address]  [address], [address]  [place]  [place] [address]  [phone]     Mar 30, 2024 10:00 AM  Postoperative visit with Curtistine CHRISTELLA Corners, MD  [facility]: [facility] ([facility] & [facility]) [address]  [address], [address]  [place]  [place] [address]  [phone]     Apr 09, 2024 9:20 AM  (Arrive by 9:05 AM)  Office visit with Roscoe KATHEE Alpers, APRN-NP  Neurology: [facility], [address] (Neurology) [phone] [address]  [address]  [place] [address]  [phone]     May 12, 2024 10:00 AM  Office visit with Emeline DELENA Ormond, MD  Cardiovascular Medicine: [facility] (CVM Exam) [address]  [address]  [place]  [place] [address]  [phone]     Oct 11, 2024 11:00 AM  (Arrive by 10:45 AM)  Office visit with Bernardino GORMAN Merritt, MD  Neurology: [facility], [address] (Neurology) [phone] [address]  [address]  [place] [address]  [phone]            Consults, Procedures, Diagnostics, Micro, Pathology   Consults: PT/OT  Surgical Procedures & Dates: 02/16/24: C4-C7 Anterior Cervical Discectomy and Fusion  Significant Diagnostic Studies, Micro and Procedures:   C-Spine AP & Lat 02/17/24:  Interval C4-C7 ACDF. Hardware appears intact without obvious surrounding   lucency. Surgical drain projects over the left anterior prevertebral soft   tissues with mild prevertebral soft tissue thickening.   Similar to slight decrease in low-grade retrolisthesis at C4-C5 and C5-C6.   Otherwise normal cervical alignment. Vertebral body heights are   maintained.   Significant Pathology: none     Wound:      Wounds Surgical incision Anterior Neck (Active)   02/16/24 0822   Wound Type: Surgical incision   Orientation: Anterior   Location: Neck   Wound Location Comments:    Initial Wound Site Closure: Sutures;Topical skin adhesive (e.g. Dermabond)   Initial Dressing Placed:    Initial Cycle:    Initial Suction Setting (mmHg):    Pressure Injury Stages:    Pressure Injury Present Within 24 Hours of Hospital Admission:    If This Pressure Injury Is Suspected to Be Device Related, Please Select the Device::    Is the Wound Open or Closed:    Wound Assessment Wound edges approximated 02/17/24 0825   Wound Site Closure Topical skin adhesive (  e.g. Dermabond);Sutures 02/17/24 0825   Peri-wound Assessment Dry;Intact;Pink 02/17/24 0825   Wound Drainage Amount None 02/17/24 0825   Wound Dressing Status None/open to air 02/17/24 0825   Number of days: 1                        Discharge Disposition, Condition   Patient Disposition: Home or Self Care [01]  Condition at Discharge: Stable    Code Status   Full Code    Patient Instructions     Activity       Activity as Tolerated   As directed      It is important to keep increasing your activity level after you leave the hospital. Moving around can help prevent blood clots, lung infection (pneumonia) and other problems.  Gradually increasing the number of times you are up moving around will help you return to your normal activity level more quickly.  Continue to increase the number of times you are up to the chair and walking daily to return to your normal activity level. Begin to work toward your normal activity level As tolerated; No driving until cleared by your surgeon at follow up appointment. Avoid pulling, pushing or lifting greater than 10 pounds.          Diet       Regular Diet   As directed      You have no dietary restriction. Please continue with a healthy balanced diet.          Wounds/Lines/Drains       Incision Care   As directed      *Your incision has glue in place. Do not pick at glue or incision.   *Keep your incision clean and dry.  *May shower, keep incision covered when showering until 10/16. You may get incision wet 3 days after surgery on 10/16.   *Wash gently, pat dry.   *No creams, ointments, lotions, hair gels or sprays to incision. No heat to incision  *Do not submerge incision in tub, pool, hot tub, or lake for 4 weeks.  *Your incision should gradually look better each day. If you notice unusual swelling, redness, drainage, have increasing pain at the site, or have a fever greater than 100 degrees, notify your physician immediately.             Discharge education provided to patient., Signs and Symptoms:   Report these signs and symptoms       Report These Signs and Symptoms   As directed      Please contact your doctor if you have any of the following symptoms: Call if temperature greater than 101, incision red, drainage or odor noted from incision, pain that is uncontrolled with pain medication, numbness or weakness, vision changes, trouble with speech or slurring of speech, or any questions/concerns.        , Education: , and Others Instructions:   Other Orders       Questions About Your Stay   Complete by: As directed      Discharging attending physician: DELMER CURTISTINE HERO    Order comments: For questions or concerns regarding your hospital stay call the clinic at [phone]. If outside normal business hours, call [phone] and ask for the neurosurgery resident on call to be paged.   Additional instructions:    Danielle Garner    Cervical 4-7 Anterior Cervical Discectomy and Fusion on 02/16/2024 with CURTISTINE DELMER, MD    Neurosurgery  Discharge Instructions  Contact information:  Call Neurosurgery if you have questions or are experiencing problems at discharge [phone].  After 5 pm and weekends please call [phone] to reach Neurosurgery on call.  Post-operative wound care:  Your incision has glue in place. Your incision may be open to air.  Keep your incision dry for 3 days. Cover incision while showering until 02/18/24.  Starting 02/19/24 use non-medicated soap to wash incision daily, pat dry and leave open to air.  Do not submerge (pool/tub) your incision under water at all for 4 weeks.  Have someone look at your incision every day. It should look the same or better daily.  Do not apply any ointment, cream, or lotions to incision line.   Activity restrictions:  Avoid pushing, pulling, lifting, or bending more than 10 pounds (about a gallon of milk). If you hold children, they should be placed in your lap or crawl into lap if old enough.  Do NOT drive until you are cleared by your physician.   Avoid bearing down or straining to have bowel movements.  Walking is the best initial therapy for recovery.  Walk several times daily as tolerated.   Post-operative pain and medications:  Please use your pain medications and muscle relaxers as prescribed.  Pain medications can make you constipated. You may take a stool softener and Miralax.  Do NOT take Ibuprofen or NSAIDS (Aleve, Motrin, Naproxen) until Doctor approved.    Tylenol is approved for pain control.  This is available over the counter.   Follow up appointment:   Scheduled appointments:      Mar 02, 2024 10:00 AM  Postoperative visit with Curtistine CHRISTELLA Corners, MD  Com[facility] Center: [facility] ([facility] & [facility]) [address]  [address], [address]  [place]  [place] [address]  [phone]     Mar 30, 2024 10:00 AM  Postoperative visit with Curtistine CHRISTELLA Corners, MD  Com[facility]: [facility] ([facility] & [facility]) [address]  [address]  [place]  [place] [address]  [phone]        Please contact Neurosurgery if you develop any of the following:  New or worsening numbness, tingling, or decrease sensation in arms or legs.  New or worsening changes in mobility or gait (walking).  Fever 101 or greater.  Redness, swelling, continuous oozing, fluid collection, warmth, or bad odor near the incision site.  Intense pain that is getting worse or unrelieved by pain medications or muscle relaxers.                     Additional Orders: Case Management, Supplies, Home Health     Home Health/DME       None                Signed:  Geofm CROME Angad Nabers, APRN-NP  02/17/2024      cc:  Primary Care Physician:  Bette Setter   Verified    Referring physicians:  Self, Referral   Additional provider(s):        Did we miss something? If additional records are needed, please fax a request on office letterhead to [phone]. Please include the patient's name, date of birth, fax number and type of information needed. Additional request can be made by email at [email or web address]. For general questions of information about electronic records sharing, call [phone].

## 2024-02-17 NOTE — Case Management (ED)
 Case Management Admission Assessment    NAME:Danielle Garner                          MRN: [id number]             DOB:06/17/1970          AGE: 53 y.o.  ADMISSION DATE: 02/16/2024             DAYS ADMITTED: LOS: 1 day      Today?s Date: 02/17/2024    The patient's medical chart was reviewed, including recent progress notes, nursing assessments, and discharge planning documents. Based on the review of the patient?s clinical status and updates from the neurosurgery team, no additional discharge case management services are indicated. All relevant discharge needs have been assessed and addressed.     If any other needs may arise please contact Case Management.     Lyle Santa, RN, BSN  Nurse Case Catering manager on [employer]: [phone]

## 2024-02-17 NOTE — Care Plan
 Problem: Glucose Management  Goal: Absence of hyperglycemia  Outcome: Goal Achieved  Goal: Absence of Hypoglycemia  Outcome: Goal Achieved  Goal: Glucose level within specified parameters  Outcome: Goal Achieved     Problem: Moderate Fall Risk  Goal: Moderate Fall Risk  Outcome: Goal Achieved

## 2024-02-17 NOTE — Anesthesia Pain Rounding
 Anesthesia Follow-Up Evaluation: Post-Procedure Day One    Name: Danielle Garner     MRN: [id number]     DOB: 1970/10/30     Age: 53 y.o.     Sex: female   __________________________________________________________________________     Procedure Date: 02/16/2024   Procedure: Procedure(s) with comments:  Cervical 4-Cervical 5, Cervical 5-Cervical 6, Cervical 6-Cervical 7  Anterior Cervical Discectomy & Fusion with neuromonitoring - Cervical 4-Cervical 5, Cervical 5-Cervical 6, Cervical 6-Cervical 7 Anterior Cervical Discectomy & Fusion with neuromonitoring   left -sided, supine, Leonce HELLER, Lonzell Dixons, microscope, Best boy, C-arm, neuromonitoring, Globus  ARTHRODESIS SPINE ANTERIOR INTERBODY WITH DISCECTOMY/ OSTEOPHYTECTOMY/ DECOMPRESSION - CERVICAL BELOW Cervical 2 - EACH ADDITIONAL INTERSPACE  INSERTION, INTERVERTEBRAL BIOMECHANICAL DEVICE, VERTEBRAL DEFECT OR INTERSPACE, ANTERIOR    Physical Assessment  Height: 165.1 cm (5' 5)  Weight: 69.2 kg (152 lb 9.6 oz)    Vital Signs (Last Filed in 24 hours)  BP: 121/62 (10/14 0804)  Temp: 36.5 ?C (97.7 ?F) (10/14 9195)  Pulse: 63 (10/14 0804)  Respirations: 17 PER MINUTE (10/14 0804)  SpO2: 100 % (10/14 0804)  O2 Device: None (Room air) (10/14 0804)  O2 Liter Flow: 6 Lpm (10/13 1212)    Patient History   Allergies  Allergies[1]     Medications  Scheduled Meds:acetaminophen (TYLENOL EXTRA STRENGTH) tablet 1,000 mg, 1,000 mg, Oral, Q6H*  docusate (COLACE) capsule 100 mg, 100 mg, Oral, BID  ezetimibe (ZETIA) tablet 10 mg, 10 mg, Oral, QDAY  gabapentin  (NEURONTIN ) capsule 300 mg, 300 mg, Oral, BID  levothyroxine (SYNTHROID) tablet 100 mcg, 100 mcg, Oral, QDAY 30 min before breakfast  methocarbamoL (ROBAXIN) tablet 750 mg, 750 mg, Oral, Q8H*  milk of magnesium oral suspension 30 mL, 30 mL, Oral, QDAY  montelukast (SINGULAIR) tablet 10 mg, 10 mg, Oral, QHS  pantoprazole DR (PROTONIX) tablet 40 mg, 40 mg, Oral, QDAY(21)  polyethylene glycol 3350 (MIRALAX) packet 17 g, 1 packet, Oral, BID  scopolamine (TRANSDERM-SCOP) 1mg  over 3 days patch 1 patch, 1 patch, Transdermal, Q72H*   And  Verification of Patch Placement and Integrity - Scopolamine base 1 mg/3 days 1 patch, 1 patch, Transdermal, BID  venlafaxine XR (EFFEXOR XR) capsule 75 mg, 75 mg, Oral, QDAY    Continuous Infusions:  PRN and Respiratory Meds:bisacodyL QDAY PRN, diphenhydrAMINE HCL Q6H PRN **OR** diphenhydrAMINE HCL Q6H PRN, ondansetron Q6H PRN, oxyCODONE Q3H PRN      Diagnostic Tests  Hematology:   Lab Results   Component Value Date    HGB 12.1 02/17/2024    HCT 35.3 02/17/2024    PLTCT 225 02/17/2024    WBC 5.40 02/17/2024    NEUT 47.1 02/03/2024    ANC 1.80 02/03/2024    ALC 1.40 02/03/2024    MONA 10.7 02/03/2024    AMC 0.40 02/03/2024    EOSA 3.6 02/03/2024    ABC 0.00 02/03/2024    MCV 98.4 02/17/2024    MCH 33.7 02/17/2024    MCHC 34.2 02/17/2024    MPV 7.9 02/17/2024    RDW 12.8 02/17/2024         General Chemistry:   Lab Results   Component Value Date    NA 143 02/17/2024    NA 137 01/25/2022    K 4.0 02/17/2024    K 4.3 01/25/2022    CL 107 02/17/2024    CL 104 01/25/2022    CO2 28 02/17/2024    CO2 25.0 01/25/2022    GAP 8 02/17/2024  GAP 8 01/25/2022    BUN 8 02/17/2024    BUN 10.0 01/25/2022    CR 0.66 02/17/2024    CR 0.76 01/25/2022    GLU 82 02/17/2024    GLU 86 01/25/2022    CA 9.0 02/17/2024    CA 9.3 01/25/2022    ALBUMIN 4.1 02/03/2024    ALBUMIN 4.1 01/25/2022    TOTBILI 0.3 02/03/2024    TOTBILI 0.4 01/25/2022      Coagulation:   Lab Results   Component Value Date    PT 11.3 02/03/2024    PTT 28.8 02/03/2024    INR 1.0 02/03/2024         Follow-Up Assessment  Patient location during evaluation: floor      Anesthetic Complications:   Anesthetic complications: The patient did not experience any anesthestic complications.      Pain:  Score: 5    Management:adequate     Level of Consciousness: awake and alert   Hydration:acceptable     Airway Patency: patent   Respiratory Status: room air Cardiovascular Status:hemodynamically stable   Regional/Neuroaxial:              Comments: The patient expressed no anesthetic concerns. Pain is well tolerated. Additional adjuncts added to pain plan this AM. No nausea/vomiting reported. Patient mobilization is appropriate. Questions encouraged and answered                  [1]   Allergies  Allergen Reactions    Gluten DIARRHEA, FLATULENCE, RASH, STOMACH UPSET, NAUSEA AND VOMITING and SEE COMMENTS     Digestive problems and rash      Almond UNKNOWN    Asa [Aspirin] SEE COMMENTS     Nose bleeds      Egg UNKNOWN

## 2024-02-17 NOTE — Progress Notes
 Neurosurgery Progress Note      Admission Date: 02/16/2024 LOS: 1 day  ______________________________________________________________    S: No acute events noted. Patient seen this morning and discussed with Dr. Delmer. Patient resting in bed, family at bedside. Reports night went well, has been up ambulating in room to bathroom overnight. Tolerating PO intake, pain controlled on current PO regimen. Discussed plan to monitor HV drain output, possible discharge this afternoon vs tomorrow pending drain removal. Questions answered.     O:                  Vital Signs: 24 Hour Range   BP: (82-135)/(42-79)   Temp:  [36.3 ?C (97.4 ?F)-36.7 ?C (98.1 ?F)]   Pulse:  [63-95]   Respirations:  [14 PER MINUTE-23 PER MINUTE]   SpO2:  [96 %-100 %]   O2 Device: None (Room air)  O2 Liter Flow: 6 Lpm     Physical Exam:  Awake and alert, conversational   MAE to command  5/5 strength throughout   SILT  Incision w/ dermabond, no hematoma   HV Drain with 45ml output/overnight     A/P: 53 y.o. 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     Prophylaxis:   A) GI: PPI  B) Lines:  No  C) Urinary Catheter:  No  D) Antibiotic Usage:  No  E) VTE:  Mechanical prophylaxis; Sequential compression device; No anticoagulation until 24 hours post operative; contraindication due to bleeding risk  F) Restraints: Patient assessed for need for restraints.     Please page [phone] with any questions.    Trevonn Hallum L Tinnie Kunin, APRN-NP  [facility]

## 2024-02-17 NOTE — Progress Notes
 PHYSICAL THERAPY  NOTE      Name: Danielle Garner   MRN: [id number]     DOB: 1970/06/19      Age: 53 y.o.  Admission Date: 02/16/2024     LOS: 1 day     Date of Service: 02/17/2024      Per discussion with OT, patient reports no concerns with mobility with no PT goals identified. PT will discontinue service, please re-consult if the patient has a decline in functional status.       Therapist: Alphonse Asbridge, PT  Date: 02/17/2024

## 2024-02-17 NOTE — Progress Notes
 Danielle Garner Danielle Garner  Discharge instructions reviewed with patient and family.  Valuables returned:   Belongings Identifier (i.e. Envelope #, Locker #, etc.): 1 number  ADL Belongings at Bedside: Eyeglasses/contacts.  Home medications:    .  Functional assessment at discharge complete: Yes .

## 2024-02-17 NOTE — Progress Notes
 OCCUPATIONAL THERAPY  NOTE   Name: Danielle Garner   MRN: [id number]     DOB: 28-Nov-1970      Age: 53 y.o.  Admission Date: 02/16/2024     LOS: 1 day     Date of Service: 02/17/2024      Patient denies changes from baseline ADL?s and functional mobility and reports no history of balance loss or falls in the last three months. Patient has not had a procedure or surgery that would make getting dressed difficult, including putting on socks and shoes.  Patient has not demonstrated or reported new difficulties with vision when completing functional tasks.  Patient endorses no concerns with functional skills at home. Currently, the patient is ambulating laps on unit with nursing staff without difficulty.    Encouraged patient to continue to perform functional skills/ADL?s while in the hospital and discussed the ability for the patient to complete their ADL?s with the bedside nursing staff.  Occupational therapy services will be discontinued at this time, please re-consult if the patient has a change in functional status.      Therapist: Kathrynne Kulinski, OTD, OTR/L, CSRS     [phone]  Date: 02/17/2024

## 2024-02-18 ENCOUNTER — Encounter: Admit: 2024-02-18 | Discharge: 2024-02-18 | Payer: BLUE CROSS/BLUE SHIELD

## 2024-02-25 ENCOUNTER — Encounter: Admit: 2024-02-25 | Discharge: 2024-02-25 | Payer: BLUE CROSS/BLUE SHIELD

## 2024-03-02 ENCOUNTER — Ambulatory Visit: Admit: 2024-03-02 | Discharge: 2024-03-03 | Payer: BLUE CROSS/BLUE SHIELD

## 2024-03-02 ENCOUNTER — Encounter: Admit: 2024-03-02 | Discharge: 2024-03-02 | Payer: BLUE CROSS/BLUE SHIELD

## 2024-03-02 VITALS — BP 113/67 | HR 71 | Ht 65.0 in | Wt 151.0 lb

## 2024-03-02 DIAGNOSIS — Z981 Arthrodesis status: Principal | ICD-10-CM

## 2024-03-02 MED ORDER — BACLOFEN 5 MG PO TAB
5 mg | ORAL_TABLET | ORAL | 0 refills | 30.00000 days | Status: AC | PRN
Start: 2024-03-02 — End: ?

## 2024-03-02 NOTE — Progress Notes [1]
 [facility]/Neurosurgery Postoperative Visit      Chief Compliant:   Chief Complaint   Patient presents with    Neck - Post-op     2wk PO - DOS - 02/16/24    Left Shoulder - Pain    Right Shoulder - Pain       DOS: 02/16/2024  Procedure: C4-7 ACDF with anterior plating    Subjective:    Danielle Garner is a 53 y.o. female who presents for scheduled 2 weeks postoperative visit. Reports doing well for the most part. Preoperative symptoms steadily improving. Her arm symptoms are better. Post operative pain she is experiencing is in her bilateral shoulders and is present all the time and has been quite bothersome.  She is taking Tylenol and Robaxin which helps some. She has some dysphagia with everything but she is able to eat all foods just slowly.  This is getting better.  She does feel a lot of reflex symptoms and has a hx of a hiatal hernia and feels like those symptoms have worsened.   Denies incision issues.    Objective:          acetaminophen (TYLENOL EXTRA STRENGTH) 500 mg tablet Take two tablets by mouth every 6 hours as needed for Pain. Max of 4,000 mg of acetaminophen in 24 hours.  Indications: pain    ALPRAZolam (XANAX) 0.25 mg tablet Take one tablet by mouth as Needed. Once every other week.    aluminum hydrox-magnesium carb (GAVISCON EXTRA STRENGTH) 160-105 mg chewable tablet Chew one tablet by mouth as Needed.    Black Cohosh 540 mg cap Take one capsule by mouth at bedtime daily.    calcium carbonate-vitamin D3 (OS-CAL 500 + D) 1250 mg/200 unit tablet Take one tablet by mouth daily. Calcium Carb 1250mg  delivers 500mg  elemental Ca    desvenlafaxine succinate (PRISTIQ) 50 mg tablet Take one tablet by mouth daily.    docusate (COLACE) 100 mg capsule Take one capsule by mouth twice daily.    EPINEPHrine  (EPIPEN  2-PAK) 1 mg/mL injection pen (2-Pack) Inject 0.3 mg (1 Pen) into thigh if needed for anaphylactic reaction. May repeat in 5-15 minutes if needed.    estradioL (ESTRACE) 1 mg tablet Take one-half tablet by mouth daily.    ezetimibe (ZETIA) 10 mg tablet Take one tablet by mouth daily.    famotidine  (PEPCID ) 40 mg tablet TAKE ONE TABLET BY MOUTH EVERY DAY 30 MINUTES before dinner    ferrous sulfate (FEOSOL) 325 mg (65 mg iron) tablet Take one tablet by mouth daily. Take on an empty stomach at least 1 hour before or 2 hours after food.    gabapentin  (NEURONTIN ) 300 mg capsule Take 300mg  at bedtime x 1 week, Take 300mg  in the AM and at bedtime x 1 week, Take 300mg  in the AM, in the evening, and at bedtime continue on this dose. (Patient taking differently: Take one capsule by mouth twice daily.)    lactobacillus rhamnosus GG (CULTURELLE) 10 billion cell capsule Take one capsule by mouth daily.    levothyroxine (SYNTHROID) 100 mcg tablet Take one tablet by mouth daily 30 minutes before breakfast.    methocarbamoL (ROBAXIN) 750 mg tablet Take one tablet by mouth every 8 hours as needed for Spasms. Indications: muscle spasm    montelukast (SINGULAIR) 10 mg tablet Take one tablet by mouth at bedtime daily.    multivitamin (ONE-A-DAY) tablet Take one tablet by mouth daily.    omeprazole  DR (PRILOSEC) 40 mg capsule TAKE  ONE CAPSULE BY MOUTH EVERY DAY before breakfast, TAKE 30 MINUTES before a meal (Patient taking differently: Take one capsule by mouth every 48 hours.)    oxyCODONE (ROXICODONE) 5 mg tablet Take one tablet by mouth every 4 hours as needed (pain not relieved by tylenol; not to exceed one week of use). Indications: pain    polyethylene glycol 3350 (MIRALAX) 17 g packet Take one packet by mouth twice daily. Indications: constipation     Vitals:    03/02/24 1415   BP: 113/67   BP Source: Arm, Right Upper   Pulse: 71   SpO2: 100%   PainSc: Five   Weight: 68.5 kg (151 lb)   Height: 165.1 cm (5' 5)     Body mass index is 25.13 kg/m?Danielle Garner     Physical Exam   Awake, alert  Oriented to person, place, setting   Motor strength 5/5 in all muscle groups   SILT   Gait normal  Incision healing well.   TTP over the bilateral trape muscles     Imaging:  None      Assessment and Plan:  Danielle Garner is a 53 y.o. female who presents for postoperative visit. Overall, doing well. Preoperative symptoms improving but she is having a lot of bilateral shoulder pain over her trapezius muscles. I recommend changing her Robaxin to Baclofen .  She was given an rx for 5mg  q8hs as needed.  She can increase her night dose to 10mg  if tolerating it.  She can also only take this at night and take a Robaxin in the morning and in the afternoon if the Baclofen  is causing too much fatigue during the day.  She has tolerated Baclofen  in the past but does remember it making her sleepy. Her incision healing well without concerns. I also recommend evaluation for her hiatal hernia as it seems to be causing her more issues recently and I am not sure it is related to her surgery. Lastly, we discussed the option of PT to work on her shoulder muscle tension, she prefers massage for now which is ok as long as her neck position remains neutral (not flexed or extended) and is not causing her pain.  She has someone she uses monthly. If this is not helping, she will call for a PT rx.   Return to clinic in 4 weeks with xrays   Post surgical restrictions:  No bending, twisting or lifting more than 10 lbs. No submerging incision.    Advised patient to call with questions/concerns as they arise.     Josette Bureau, PA-C  Neurosurgery Spine

## 2024-03-09 ENCOUNTER — Encounter: Admit: 2024-03-09 | Discharge: 2024-03-09 | Payer: BLUE CROSS/BLUE SHIELD

## 2024-03-23 ENCOUNTER — Encounter: Admit: 2024-03-23 | Discharge: 2024-03-23 | Payer: BLUE CROSS/BLUE SHIELD

## 2024-03-30 ENCOUNTER — Encounter: Admit: 2024-03-30 | Discharge: 2024-03-30 | Payer: BLUE CROSS/BLUE SHIELD

## 2024-03-30 ENCOUNTER — Ambulatory Visit: Admit: 2024-03-30 | Discharge: 2024-03-30 | Payer: BLUE CROSS/BLUE SHIELD

## 2024-03-30 VITALS — BP 101/58 | HR 67 | Ht 65.0 in | Wt 151.0 lb

## 2024-03-30 DIAGNOSIS — Z981 Arthrodesis status: Principal | ICD-10-CM

## 2024-03-30 NOTE — Progress Notes [1]
 [facility]/Neurosurgery Postoperative Visit      Chief Compliant:   Chief Complaint   Patient presents with    Neck - Pain, Post-op     6wk PO - DOS - 02/16/24       DOS: 02/16/2024  Procedure: C4-7 ACDF     Subjective:    Danielle Garner is a 53 y.o. female who presents for 6-week postoperative visit.  Patient is accompanied by her husband for today visitation.  Reports overall she is doing well.  She is pleased with surgical outcome.  She reports shoulder discomfort appears myofascial.  Denies swallowing difficulty.  Incision is healing well.           Objective:          acetaminophen  (TYLENOL  EXTRA STRENGTH) 500 mg tablet Take two tablets by mouth every 6 hours as needed for Pain. Max of 4,000 mg of acetaminophen  in 24 hours.  Indications: pain    ALPRAZolam (XANAX) 0.25 mg tablet Take one tablet by mouth as Needed. Once every other week.    aluminum hydrox-magnesium  carb (GAVISCON EXTRA STRENGTH) 160-105 mg chewable tablet Chew one tablet by mouth as Needed.    baclofen  (LIORESAL ) 5 mg tablet Take one tablet by mouth every 8 hours as needed for Muscle Cramps.    Black Cohosh 540 mg cap Take one capsule by mouth at bedtime daily.    calcium carbonate-vitamin D3 (OS-CAL 500 + D) 1250 mg/200 unit tablet Take one tablet by mouth daily. Calcium Carb 1250mg  delivers 500mg  elemental Ca    desvenlafaxine succinate (PRISTIQ) 50 mg tablet Take one tablet by mouth daily.    docusate (COLACE) 100 mg capsule Take one capsule by mouth twice daily.    EPINEPHrine  (EPIPEN  2-PAK) 1 mg/mL injection pen (2-Pack) Inject 0.3 mg (1 Pen) into thigh if needed for anaphylactic reaction. May repeat in 5-15 minutes if needed.    estradioL (ESTRACE) 1 mg tablet Take one-half tablet by mouth daily.    ezetimibe  (ZETIA ) 10 mg tablet Take one tablet by mouth daily.    famotidine  (PEPCID ) 40 mg tablet TAKE ONE TABLET BY MOUTH EVERY DAY 30 MINUTES before dinner    ferrous sulfate (FEOSOL) 325 mg (65 mg iron) tablet Take one tablet by mouth daily. Take on an empty stomach at least 1 hour before or 2 hours after food.    gabapentin  (NEURONTIN ) 300 mg capsule Take 300mg  at bedtime x 1 week, Take 300mg  in the AM and at bedtime x 1 week, Take 300mg  in the AM, in the evening, and at bedtime continue on this dose. (Patient taking differently: Take one capsule by mouth twice daily.)    lactobacillus rhamnosus GG (CULTURELLE) 10 billion cell capsule Take one capsule by mouth daily.    levothyroxine  (SYNTHROID ) 100 mcg tablet Take one tablet by mouth daily 30 minutes before breakfast.    methocarbamoL  (ROBAXIN ) 750 mg tablet Take one tablet by mouth every 8 hours as needed for Spasms. Indications: muscle spasm    montelukast  (SINGULAIR ) 10 mg tablet Take one tablet by mouth at bedtime daily.    multivitamin (ONE-A-DAY) tablet Take one tablet by mouth daily.    omeprazole  DR (PRILOSEC) 40 mg capsule TAKE ONE CAPSULE BY MOUTH EVERY DAY before breakfast, TAKE 30 MINUTES before a meal (Patient taking differently: Take one capsule by mouth every 48 hours.)    oxyCODONE  (ROXICODONE ) 5 mg tablet Take one tablet by mouth every 4 hours as needed (pain not relieved by  tylenol ; not to exceed one week of use). Indications: pain    polyethylene glycol 3350  (MIRALAX ) 17 g packet Take one packet by mouth twice daily. Indications: constipation     Vitals:    03/30/24 1020   BP: 101/58   BP Source: Arm, Left Upper   Pulse: 67   SpO2: 99%   PainSc: Zero   Weight: 68.5 kg (151 lb)   Height: 165.1 cm (5' 5)     Body mass index is 25.13 kg/m?Danielle Garner     Physical Exam  Awake, alert  Speech conversational  CN II-XII grossly intact   Motor strength 5/5 in all muscle groups   SILT   Gait normal   Left anterior neck incision well healed without erythema or dehiscence     Imaging:  Cervical  AP/L films reviewed. Show stable placement of hardware at C4-7 without surrounding hardware lucency or fracture.        Assessment and Plan:  Danielle Garner is a 53 y.o. female who presents for 6-week postoperative visit.  S/p C4-7 ACDF. Overall, doing well. Preoperative symptoms improving, patient pleased with surgical outcome. Incision healing well.  Discussed liberalizing postoperative restrictions, can gradually increase lifting tolerance and can submerge incision in water . Discussed PT for strength and conditioning, provided external prescription    Return to clinic in 6 weeks. Advised patient to call with questions/concerns as they arise.     Problem   History of Fusion of Cervical Spine                   Danielle Garner. Delmer, MD  Neurosurgery, Assistant Professor

## 2024-04-09 ENCOUNTER — Encounter: Admit: 2024-04-09 | Discharge: 2024-04-09 | Payer: BLUE CROSS/BLUE SHIELD

## 2024-04-09 ENCOUNTER — Ambulatory Visit: Admit: 2024-04-09 | Discharge: 2024-04-10 | Payer: BLUE CROSS/BLUE SHIELD

## 2024-04-09 VITALS — BP 114/77 | HR 66

## 2024-04-09 DIAGNOSIS — G909 Disorder of the autonomic nervous system, unspecified: Secondary | ICD-10-CM

## 2024-04-09 DIAGNOSIS — M4802 Spinal stenosis, cervical region: Principal | ICD-10-CM

## 2024-04-09 NOTE — Progress Notes [1]
 Subjective:       History of Present Illness  Danielle Garner is a 53 y.o. female who is patient of Dr. Karolynn that presents today for follow up of autonomic dysfunction and back pain. Please see below for any updates:     Today she reports that things are going very well. She saw NSG and had a C4-C7 anterior cervical diskectomy and fusion in October 2025. She has increase in range of motion. She is currently in physical therapy.     She no longer has numbness and tingling in the hands. This has completely resolved.    She reports that her headaches are different now. She continues to clench her jaw but she thinks that is due to just the changes in sleep habits. She also has increase stress this time of the year.    Dizziness has also improved and does not seem to be nearly as bad she states.     She was started on Gabapentin  at our last visit, and she feels that this is very beneficial. She would like to continue taking this.    She also continues to take Baclofen  10mg  and is tolerating well.     She continues to take electrolytes which she feels like is very beneficial. She has seen improvement in blood pressure and bowel movements.       Last Clinical Summary 01/09/24:   Ms. Gershman presents today to discuss her MRI findings. She continues to follow up with Dr. Lizette at the pain clinic. She has done multiple epidurals. She gets relief for about a week and the pain returns. Within 2 weeks she is back to where she returned. Dr. Lizette wanted her to repeat PT.     She feels like she is not getting full range in her right arm. She is very active. She enjoys to golf and playing with her grandkids. She has fears that she will decline and not be able to do these things. She has tingling in her right hand. Denies dropping any items. She denies any falls.     She is on Robaxin  and she states she is unsure benefit but does not like to miss doses just incase it is working.     She has a little dizziness but thinks this is related to her low blood pressure occasionally. Overall, these symptoms have been more stable.     She has had more headaches. She has about daily headaches this week alone. She reports 8-10 headaches a month. They are on the top of the head, occipital region and can spread to her ears.     She would like to manage her pain a little bit better.       MRI C spine wo contrast 10/30/23: 1. Degenerative changes of cervical spine. 2. 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 3. Retrolisthesis C5 on C6 with moderate spinal stenosis, severe right and  moderate left neural foraminal stenosis. 4. Mild broad right posterior C6-C7 osteophytic spurring/disc bulging with right lateral recess stenosis and severe right neural foraminal stenosis.   MRI L Spine wo contrast 10/30/23: 1. Small central posterior L4-L5 disc protrusion/extrusion with slight caudal extension. 2. Short pedicles and degenerative changes L4-L5 apophyseal joints with mild spinal stenosis. 3. Chronic degenerative changes L5-S1 disc with mild circumferential disc bulge/osteophytic spurring. 4. Mild left L5-S1 neural foraminal stenosis.     Last Clinical Summary 10/09/23:  Today she presents with her husband. She states that things  have been going well. A couple of months ago she has had a dizzy spell and has a feeling like she is going to black out, but it goes away with time. She was having these spells daily and now she is having 2 spells in the last 2 weeks so she has had some improvement. During this time she will have head pressure. Episodes last for a couple of minutes. She has constant TMJ pain. She has tried 2 onabotulinumtoxin A treatments, she did her last round last Friday.     She feels like she continues to have brain fog but the short term memory does not seem as bad. She said she wont be able to remember simple things such as what she had for dinner last night. But she does feel like these symptoms have improved.     She is on Baclofen  5mg  TID --> this has not been helpful, and it made her drowsy. She stopped this.     She has completed PT and got benefit of her range of motion.     Her muscle tension is her biggest concern. It is mainly in her neck and shoulders.    She states her back is always tight and she has tightness in her forearms. She does state she types a lot at work and she states she knows that it contributes.     She has constant neck pain.     She continues to have chronic constipation. Followed up with GI previously about this.    She has tried Midodrine  in the past but did not feel well on this. She quit taking this.       Last Clinical Summary 02/26/23:  In the interim, she has noticed worsening muscle tension in her neck and upper back. This is a relatively diffuse in its involvement. She is taking robaxin  750 mg once daily. She has jaw tightness that she has received onabotulinumtoxinA for TMJ that did not help. Cyclobenzaprine  in the past did not help.     She is having issues with brain fog and short term memory issues. This is intermittent and not everyday. She has noticed intermittent balance issues in the mornings. She has noticed a poor sense of smell.     She denies significant issues with dizziness.     Clinical summary at last visit 07/05/22:   Since the cessation of spironolactone and implementing lifestyle changes, she reports no issues with dizziness episodes. We discussed the cause of this remains undetermined at this time. She denies heat intolerance, abnormal sweating, tremor, gait issues, muscle stiffness, mood changes, palpitations, fatigue, poor sense of smell, neuropathy, GI upset, dry eyes or eye mouth, or vision changes. She does have longstanding issues with constipation but she feels this is manageable at this time. We agreed to proceed with clinical monitoring for the time being.     Clinical summary at last visit 01/02/22:   Interval tilt table testing confirmed autonomic dysfunction (sympathoadrenal dysfunction). There is no history of T2DM or neuropathy. She does complain of poor sense of smell and rarely acting out her dreams at night (rare). 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 She does not display any other parkinsonian symptoms at this time. We discussed completing skin biopsy and starting fluorinef for symptomatic management. She did not tolerate midodrine  so we will stop this.     Clinical summary at last visit 08/15/21:   Spells began insidiously about 1 year ago. These occur about 15x per month and  consist of involuntarily holding her breath for 4-5 seconds followed by a mild-moderate bitemporal headache, vision blurring + tunnel vision and dizziness lasting 5-10 seconds without interruption in consciousness and immediately returns to baseline. This tends to occur while she is upright and exerting herself doing household chores. This does not occur in a lying position. Neuro exam is unremarkable today. MRI head in 04/2021 is normal.     We discussed my impression of breath holding likely causative of presyncope. We discussed optimization of fluids, salt intake, compression stockings, crossing legs/tensing muscle prior to position changes. Will obtain echo, CTA head and neck to look for vertebrobasilar insufficiency. We also discuss treatment options for breath holding. I asked that she also speak to her Pulmonologist about these symptoms. Neurologic diagnostic considerations include motor tics and autonomic dysfunction. Will order autonomic testing and tilt table testing to investigate further. Consider trial of guanfacine or tetrabenazine.     HPI:   Spells + headache   Onset: 2022, insidious. She will hold her breath for 5 seconds or so (involuntary)     Location: bitemporal   Quality: pressure-like   Severity: 3-7/10  Total headache days per month: 15   Duration: 5 seconds   Associated symptoms: blurred vision, tunnel vision    Denies: lacrimation/rhinorrhea, positional changes, ringing in ears/roaring sounds   Aura: no   Triggers: occurs exclusively in the upright position, while doing chores around the house   Sleep/snoring: muscle tension, neck pan, grind her teeth, takes methocarbamol  to help her sleep  Hx of head injury: no   Family hx of migraine: no  Last Eye exam: annual   Type of Work: bookkeeper for [employer]   MOH risk: no     She denies vertigo and hearing loss or palpitations.     She reports good oral intake. She does not add salt to her food. She denies LOC.     Relevant PMHx:  Anxiety/depression   Chronic headaches   Dizziness   Menopause on HRT   Chronic neck and muscle tension   Seasonal allergies   Chronic cough related silent reflux     Prior Workup:   [facility] MRI Head 05/04/21: normal  [facility] Tilt table 10/2021: + of dysautonomia, sympathoadrenal dysfunction suggested and low cardiovagal function   [facility] CTA head and neck 10/2021: no significant stenosis, degenerative spine changes   __________________________________________________________________________________    Past Medical History:    Anxiety disorder    Blurry vision    Cervical radiculopathy    Chronic bilateral low back pain    Constipation    DDD (degenerative disc disease), cervical    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 back pain and lower extremity pain    Dizziness    Fibromyalgia    Foraminal stenosis of cervical region    Foraminal stenosis of lumbar region    Generalized headaches    GERD (gastroesophageal reflux disease)    Gluten intolerance    Headache    High cholesterol    Hypersensitivity pneumonitis (CMS-HCC)    Hypothyroidism    Infection    Lumbar radiculopathy    Lung disease    Neck pain    Other dysphagia    PONV (postoperative nausea and vomiting)    Seasonal allergic reaction    Spinal stenosis    Spinal stenosis of cervical region    Unspecified deficiency anemia    Vision decreased     Surgical History:   Procedure Laterality  Date LUNG BIOPSY Right 02/03/2009    TENOTOMY Left 02/08/2016    arm-elbow    ESOPHAGOGASTRODUODENOSCOPY WITH SPECIMEN COLLECTION BY BRUSHING/ WASHING N/A 06/21/2021    Performed by Bonnetta Camellia BROCKS, MD at [facility]    ESOPHAGOGASTRODUODENOSCOPY WITH BIOPSY - FLEXIBLE N/A 06/21/2021    Performed by Bonnetta Camellia BROCKS, MD at [facility] OR    Cervical 4-Cervical 5, Cervical 5-Cervical 6, Cervical 6-Cervical 7  Anterior Cervical Discectomy & Fusion with neuromonitoring Left 02/16/2024    Performed by Delmer Curtistine HERO, MD at [facility] OR    ARTHRODESIS SPINE ANTERIOR INTERBODY WITH DISCECTOMY/ OSTEOPHYTECTOMY/ DECOMPRESSION - CERVICAL BELOW Cervical 2 - EACH ADDITIONAL INTERSPACE N/A 02/16/2024    Performed by Delmer Curtistine HERO, MD at [facility] OR    INSERTION, INTERVERTEBRAL BIOMECHANICAL DEVICE, VERTEBRAL DEFECT OR INTERSPACE, ANTERIOR N/A 02/16/2024    Performed by Delmer Curtistine HERO, MD at [facility] OR    HX ARTHROSCOPIC SURGERY      HX BACK SURGERY      HX HYSTERECTOMY  07/28/2023    LAMINECTOMY  11/04/1998    SURGERY  11/04/1998    Lower lumbar lamenectomy    TILT TABLE STUDY       Social History     Tobacco Use    Smoking status: Former     Current packs/day: 0.00     Average packs/day: 1 pack/day for 6.3 years (6.3 ttl pk-yrs)     Types: Cigarettes     Start date: 05/06/1986     Quit date: 09/03/1992     Years since quitting: 31.6    Smokeless tobacco: Never    Tobacco comments:     Quit smoking 24yrs ago   Vaping Use    Vaping status: Never Used   Substance Use Topics    Alcohol use: Not Currently     Alcohol/week: 7.0 standard drinks of alcohol     Types: 7 Standard drinks or equivalent per week     Comment: social drinker    Drug use: Never     Family History   Problem Relation Name Age of Onset    Diabetes Type II Father [place]     Diabetes Father [place]         Diagnosed 2022    Hearing Loss Father [place]     Thyroid Disease Mother Angeline      Allergies   Allergen Reactions    Gluten DIARRHEA, FLATULENCE, RASH, STOMACH UPSET, NAUSEA AND VOMITING and SEE COMMENTS     Digestive problems and rash      Almond UNKNOWN    Asa [Aspirin] SEE COMMENTS     Nose bleeds      Egg UNKNOWN     Review of Systems  ROS negative unless otherwise specified in HPI    Objective:          acetaminophen  (TYLENOL  EXTRA STRENGTH) 500 mg tablet Take two tablets by mouth every 6 hours as needed for Pain. Max of 4,000 mg of acetaminophen  in 24 hours.  Indications: pain    ALPRAZolam (XANAX) 0.25 mg tablet Take one tablet by mouth as Needed. Once every other week.    aluminum hydrox-magnesium  carb (GAVISCON EXTRA STRENGTH) 160-105 mg chewable tablet Chew one tablet by mouth as Needed.    baclofen  (LIORESAL ) 5 mg tablet Take one tablet by mouth every 8 hours as needed for Muscle Cramps.    Black Cohosh 540 mg cap Take one capsule by mouth at bedtime daily.  calcium carbonate-vitamin D3 (OS-CAL 500 + D) 1250 mg/200 unit tablet Take one tablet by mouth daily. Calcium Carb 1250mg  delivers 500mg  elemental Ca    desvenlafaxine succinate (PRISTIQ) 50 mg tablet Take one tablet by mouth daily.    docusate (COLACE) 100 mg capsule Take one capsule by mouth twice daily.    EPINEPHrine  (EPIPEN  2-PAK) 1 mg/mL injection pen (2-Pack) Inject 0.3 mg (1 Pen) into thigh if needed for anaphylactic reaction. May repeat in 5-15 minutes if needed.    estradioL (ESTRACE) 1 mg tablet Take one-half tablet by mouth daily.    ezetimibe  (ZETIA ) 10 mg tablet Take one tablet by mouth daily.    famotidine  (PEPCID ) 40 mg tablet TAKE ONE TABLET BY MOUTH EVERY DAY 30 MINUTES before dinner    ferrous sulfate (FEOSOL) 325 mg (65 mg iron) tablet Take one tablet by mouth daily. Take on an empty stomach at least 1 hour before or 2 hours after food.    gabapentin  (NEURONTIN ) 300 mg capsule Take 300mg  at bedtime x 1 week, Take 300mg  in the AM and at bedtime x 1 week, Take 300mg  in the AM, in the evening, and at bedtime continue on this dose. (Patient taking differently: Take one capsule by mouth twice daily.)    lactobacillus rhamnosus GG (CULTURELLE) 10 billion cell capsule Take one capsule by mouth daily.    levothyroxine  (SYNTHROID ) 100 mcg tablet Take one tablet by mouth daily 30 minutes before breakfast.    methocarbamoL  (ROBAXIN ) 750 mg tablet Take one tablet by mouth every 8 hours as needed for Spasms. Indications: muscle spasm    montelukast  (SINGULAIR ) 10 mg tablet Take one tablet by mouth at bedtime daily.    multivitamin (ONE-A-DAY) tablet Take one tablet by mouth daily.    omeprazole  DR (PRILOSEC) 40 mg capsule TAKE ONE CAPSULE BY MOUTH EVERY DAY before breakfast, TAKE 30 MINUTES before a meal (Patient taking differently: Take one capsule by mouth every 48 hours.)    oxyCODONE  (ROXICODONE ) 5 mg tablet Take one tablet by mouth every 4 hours as needed (pain not relieved by tylenol ; not to exceed one week of use). Indications: pain    polyethylene glycol 3350  (MIRALAX ) 17 g packet Take one packet by mouth twice daily. Indications: constipation     Vitals:    04/09/24 0904   BP: 114/77   BP Source: Arm, Left Upper   Pulse: 66   SpO2: 98%   PainSc: Four       There is no height or weight on file to calculate BMI.     General: alert, oriented x 3  Speech: normal, no dysarthria  Ext: no leg edema  Cranial nerves: II Visual fields full to finger counting; III, IV, VI PERRL, extraocular muscles intact, no nystagmus; V facial sensation intact; VII facial expression symmetric; VIII hearing intact to conversation;  Motor: (Right/Left)  Deltoid 5/5, Biceps 5/5, Triceps 5/5, Finger ext 5/5, interossei 5/5, Hip Flexion 5/5, Knee ext 5/5, Knee flex 5/5, Ankle dorsiflexion 5/5, Ankle plantarflexion 5/5  Sensory: Normal to light touch.   Coordination: normal finger nose finger, rapid alternating movements and finger tapping speed normal, and heel to shin smooth without ataxia  Reflexes: (Right/Left) Biceps 2/2, Triceps 2/2, Brachioradialis 2/2, Knee Jerks 2/2, Ankle Jerks 2/2,   Abnormal Movements: no tremors, no rigidity, no bradykinesia  Gait: normal based, good arm swing    ASSESSMENT/PLAN:  CLINICAL SUMMARY      Maude JONETTA Aid 53 y.o. female with notable history of  hypersensitivity pneumonitis, chronic allergies, menopause off of HTR here today for follow up on back pain. Autonomic symptoms have improved. Neck pain was resolved with surgery,       Impression:  1. Autonomic dysfunction   2. Severe cervical stenosis      1. Cervical stenosis of spinal canal        2. 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  After a discussion, the patient agrees with the plan.     FOLLOWUP PLAN  Return for In-Person, Telehealth. Follow up June 2026 with Dr. Karolynn.   The patient is instructed to contact me if there are any concerns with the agreed plan.    Total of 23 minutes were spent on the same day of the visit including preparing to see the patient, obtaining and/or reviewing separately obtained history, performing a medically appropriate examination and/or evaluation, counseling and educating the patient/family/caregiver, ordering medications, tests, or procedures, referring and communication with other health care professionals, documenting clinical information in the electronic or other health record independently interpreting results and communicating results to the patient/family/caregiver, and care coordination.

## 2024-05-11 ENCOUNTER — Encounter: Admit: 2024-05-11 | Discharge: 2024-05-11 | Payer: BLUE CROSS/BLUE SHIELD

## 2024-05-12 ENCOUNTER — Encounter: Admit: 2024-05-12 | Discharge: 2024-05-12 | Payer: BLUE CROSS/BLUE SHIELD

## 2024-05-12 ENCOUNTER — Ambulatory Visit: Admit: 2024-05-12 | Discharge: 2024-05-13 | Payer: BLUE CROSS/BLUE SHIELD

## 2024-05-12 VITALS — BP 110/70 | HR 63 | Ht 65.0 in | Wt 152.0 lb

## 2024-05-12 DIAGNOSIS — Z136 Encounter for screening for cardiovascular disorders: Secondary | ICD-10-CM

## 2024-05-12 DIAGNOSIS — R55 Syncope and collapse: Secondary | ICD-10-CM

## 2024-05-12 DIAGNOSIS — G909 Disorder of the autonomic nervous system, unspecified: Principal | ICD-10-CM

## 2024-05-12 NOTE — Progress Notes [1]
 Date of Service: 05/12/2024    Danielle Garner is a 54 y.o. female.       HPI     This is a delightfully pleasant 54 year old female whom is seen at the Department of cardiovascular medicine clinic at the [facility]  [facility] system today for ongoing cardiovascular care.  Her husband, Danielle Garner, is also a patient of mine.  She had an abnormal tilt table test performed in June 2023.  Her past medical history includes seasonal allergies, gastroesophageal reflux, and hypothyroidism.  She has remote history of tobacco use approximately 20 years ago.  There is minimal social alcohol use.    She tells me that a few years ago she was started on some hormone replacement therapy.  She specifically mentions of spironolactone to prevent hair loss as well.  Since that time she had some trouble focusing her vision, lightheadedness, and dizziness.  She never had frank syncope.  She has been seen by neurology as well.  She was taken off of spironolactone and her symptoms essentially resolved.  They went ahead with some cardiovascular testing as detailed below.  She was started on midodrine , but actually felt worse.  She really has not had any symptoms since stopping spironolactone.  We actually had her stop the midodrine  altogether in the past.  She had some intentional weight loss of about 40 pounds over the past year+.  She has 2 daughters that got married last year.  They have their first granddaughter, who is currently 51 weeks old.  She continues to have just a few dizzy spells typically in the morning when she is rushing around.  She has not had any falls or loss of consciousness.  She continues to be cognizant of electrolyte replacement and hydration.    Pertinent cardiovascular testing:  Twelve-lead ECG in our office today shows normal sinus rhythm at a rate of 60 bpm.  There is a normal axis and normal intervals.  There is borderline low voltage.  Autonomic function testing with tilt was performed in June 2023.  There was significant dysautonomia with some pathogenic decreased function with a delayed response.  After about 6 minutes the patient's blood pressure declined by 30 mmHg with no change in heart rate.  She was symptomatic but did not lose consciousness.  The cardiovagal function was low normal.  The blood pressure response to Valsalva pattern was within normal limits.  Resting echo Doppler study was performed in May 2023.  LV cavity size was upper limits of normal.  LV systolic function was within normal limits with an EF of 55 to 60%.  There were no regional wall motion abnormalities.  There were no valvular abnormalities.  Estimated PASP was normal at 22 mmHg.       Vitals:    05/12/24 0927   BP: 110/70   BP Source: Arm, Right Upper   Pulse: 63   SpO2: 99%   O2 Device: None (Room air)   Weight: 68.9 kg (152 lb)   Height: 165.1 cm (5' 5)     Body mass index is 25.29 kg/m?SABRA     Past Medical History  Patient Active Problem List    Diagnosis Date Noted    History of fusion of cervical spine 03/30/2024    Spinal stenosis of cervical region     Cervical radiculopathy     Foraminal stenosis of lumbar region     Foraminal stenosis of cervical region     Neck pain  Chronic bilateral low back pain     Lumbar radiculopathy     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 back pain and lower extremity pain     DDD (degenerative disc disease), cervical     Autonomic dysfunction 01/29/2022     10/09/2021 Tilt:      Autonomic function testing results are abnormal.  Significant dysautonomia.  Sympathoadrenergic function is decreased based on the tilt table test with a delayed response  Cardiovagal function is low normal  However blood pressure response to Valsalva pattern seems to be present.      Pre-syncope 01/29/2022     09/26/2021 Echo:   Normal left ventricular cavitary dimensions with normal geometry   Normal left ventricular systolic function with a visually estimated ejection fraction of ~55-60 %.  Normal right ventricular cavitary size and systolic function.  Normal left ventricular diastolic function.  No left ventricular focal regional wall motion abnormality.  No chamber enlargement.  No hemodynamically significant valvular stenosis or regurgitation.  No pericardial effusion.  Estimated PA systolic pressure is 22 mmHg.        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 Since then she has been avoiding gluten, wheat, yeast, almonds, peanut, and tree nuts. Testing initially done due to some vague GI symptoms of indigestion and she feels better with these foods cut out. She also sometimes has bloating with egg whites and would like to be tested for this as she is interested in the possibility of reintroducing certain foods if she can.    SPT to  Wheat: Negative  Almond: Negative  Peanut: Negative  Pecan: Negative  Pistachio: Negative  Egg White: Positive    Plan:  - IgE to the foods tested above  - If IgE testing is also negative, will discuss scheduling an oral challenge to any food she prefers in the office   - If she is interested in pursuing testing to other tree nuts not done today she can let us  know       Non-seasonal allergic rhinitis 01/29/2021     She has a history of nasal congestion and rhinorrhea that has been progressively worsening since 2008. Previously had aeroallergen IgE testing at an allergy clinic in [place] that was positive to a number of aeroallergens. She tried AIT but did not tolerate it due to fatigue, muscle aches. Currently taking Singulair  10mg  daily which she finds beneficial. She has tried multiple intranasal corticosteroids but did not tolerate these.     Today she would like to know her aeroallergen pattern and discuss ways to optimize symptom control.    Aeroallergen SPT 01/29/21: Positive to 4 trees, 2 grasses, 3 weeds, dust mite, cockroach, feather, 3 molds    Plan:  - Continue Singulair  10mg  daily   - Discussed aeroallergen avoidance techniques  - Start Astelin  nasal spray 1-2 SPEN 1-2x/day, she will let us  know if she is not able to tolerate this      Cough 01/29/2021     States she has worsening episodes of cough over the last few years. In 2009 she saw a pulmonologist and was diagnosed with hypersensitivity pneumonitis but has not since followed up. No previous hx of asthma. States currently she thinks her cough is caused by PND. Has used Advair and albuterol intermittently which were helpful. Spirometry in office today was normal. The etiology of her cough could be multifactorial,  with a component of post nasal drip as a trigger. Spirometry not consistent with asthma at this time. She does have a history of pneumonitis so she does need to follow up with pulmonology to monitor this.     Spirometry 01/29/21: Normal     Plan:  - Discussed importance of following up with Pulmonology given her history of pneumonitis  - Continue current management as being managed by her PCP            Review of Systems   Constitutional: Negative.   HENT: Negative.     Eyes: Negative.    Cardiovascular: Negative.    Respiratory: Negative.     Endocrine: Negative.    Hematologic/Lymphatic: Negative.    Skin: Negative.    Musculoskeletal: Negative.    Gastrointestinal: Negative.    Genitourinary: Negative.    Neurological: Negative.    Psychiatric/Behavioral: Negative.     Allergic/Immunologic: Negative.    All other systems reviewed and are negative.      Physical Exam   Nursing note and vitals reviewed.  Constitutional: She appears well-developed. No distress.   HENT:   Head: Normocephalic and atraumatic.   Nose: Nose normal.   Eyes: Pupils are equal, round, and reactive to light. Conjunctivae are normal. No scleral icterus.   Neck: No JVD present. Carotid bruit is not present (bilaterally).   Cardiovascular: Normal rate, regular rhythm and normal heart sounds. Exam reveals no gallop and no friction rub.   No murmur heard.  Pulmonary/Chest: Effort normal and breath sounds normal. No respiratory distress. She has no wheezes. She has no rales.   Abdominal: Soft. Bowel sounds are normal. She exhibits no distension. There is no abdominal tenderness. There is no guarding.   Musculoskeletal:         General: No tenderness. Normal range of motion.      Cervical back: Normal range of motion.   Lymphadenopathy:     She has no cervical adenopathy.   Neurological: She is alert and oriented to person, place, and time. No cranial nerve deficit. Coordination normal.   Skin: Skin is warm and dry. No rash noted. She is not diaphoretic. No erythema.   Psychiatric: Her behavior is normal.         Cardiovascular Studies      Cardiovascular Health Factors  Vitals BP Readings from Last 3 Encounters:   05/12/24 110/70   04/09/24 114/77   03/30/24 101/58     Wt Readings from Last 3 Encounters:   05/12/24 68.9 kg (152 lb)   03/30/24 68.5 kg (151 lb)   03/02/24 68.5 kg (151 lb)     BMI Readings from Last 3 Encounters:   05/12/24 25.29 kg/m?   03/30/24 25.13 kg/m?   03/02/24 25.13 kg/m?      Smoking Social History     Tobacco Use   Smoking Status Former    Current packs/day: 0.00    Average packs/day: 1 pack/day for 6.3 years (6.3 ttl pk-yrs)    Types: Cigarettes    Start date: 05/06/1986    Quit date: 09/03/1992    Years since quitting: 31.7   Smokeless Tobacco Never   Tobacco Comments    Quit smoking 21yrs ago      Lipid Profile Cholesterol   Date Value Ref Range Status   06/27/2023 148  Final     HDL   Date Value Ref Range Status   06/27/2023 52  Final     LDL   Date  Value Ref Range Status   06/27/2023 79  Final     Triglycerides   Date Value Ref Range Status   06/27/2023 85  Final      Blood Sugar No results found for: HGBA1C  Glucose   Date Value Ref Range Status   02/17/2024 82 70 - 100 mg/dL Final   90/69/7974 96 70 - 100 mg/dL Final   90/77/7976 86 70 - 105 mg/dL Final          Problems Addressed Today  Encounter Diagnoses   Name Primary?    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  We also discussed the use of compression stockings  I do not feel strongly that she needs any further cardiovascular testing at this time.  I did think it was okay for her to discontinue her previous low-dose midodrine .  This was only being taken once a day and unlikely to be having any significant benefit upon my line of questioning.  She has not started the fludrocortisone , and we will continue to hold off at this time.    Dyslipidemia:  Most recent fasting lipid profile was in February 2025.  Total cholesterol 148, triglycerides 85, HDL 52, and LDL 79  This was down from an LDL of 131 in October 2024 after starting on ezetimibe  10 mg nightly  She would really like to try to come off ezetimibe  at this time.  I think this is probably a reasonable approach.  Will plan to repeat a fasting lipid profile with her primary care provider over the summer.  If her LDL has gone back up to above 130 or so, then would recommend coronary artery calcium scan.  Patient is in agreement.  If her coronary artery calcium scan were to be anything other than 0, then I would resume ezetimibe  10 mg nightly.    Patient's questions were answered and they agreed with the above plan.  Specific instructions were typed into their After Visit Summary document.  Follow-up in 12 months or sooner as needed.  Thank you for the opportunity to participate in the care of your patient.  Please call with questions or concerns.    Emeline Ormond, MD, [facility]  Department of Cardiovascular Medicine  [facility]       Total time spent on today's office visit was 30 minutes. This includes face-to-face in person visit with patient as well as nonface-to-face time including review of the EMR, outside records, labs, radiologic studies, echocardiogram & other cardiovascular studies, formulation of treatment plan, after visit summary, future disposition, and lastly on documentation.    Current Medications (including today's revisions)   acetaminophen  (TYLENOL  EXTRA STRENGTH) 500 mg tablet Take two tablets by mouth every 6 hours as needed for Pain. Max of 4,000 mg of acetaminophen  in 24 hours.  Indications: pain    ALPRAZolam (XANAX) 0.25 mg tablet Take one tablet by mouth as Needed. Once every other week.    aluminum hydrox-magnesium  carb (GAVISCON EXTRA  STRENGTH) 160-105 mg chewable tablet Chew one tablet by mouth as Needed.    baclofen  (LIORESAL ) 5 mg tablet Take one tablet by mouth every 8 hours as needed for Muscle Cramps.    Black Cohosh 540 mg cap Take one capsule by mouth at bedtime daily.    calcium carbonate-vitamin D3 (OS-CAL 500 + D) 1250 mg/200 unit tablet Take one tablet by mouth daily. Calcium Carb 1250mg  delivers 500mg  elemental Ca    desvenlafaxine succinate (PRISTIQ) 50 mg tablet Take one tablet by mouth daily.    docusate (COLACE) 100 mg capsule Take one capsule by mouth twice daily.    EPINEPHrine  (EPIPEN  2-PAK) 1 mg/mL injection pen (2-Pack) Inject 0.3 mg (1 Pen) into thigh if needed for anaphylactic reaction. May repeat in 5-15 minutes if needed.    estradioL (ESTRACE) 1 mg tablet Take one-half tablet by mouth daily.    famotidine  (PEPCID ) 40 mg tablet TAKE ONE TABLET BY MOUTH EVERY DAY 30 MINUTES before dinner    ferrous sulfate (FEOSOL) 325 mg (65 mg iron) tablet Take one tablet by mouth daily. Take on an empty stomach at least 1 hour before or 2 hours after food.    gabapentin  (NEURONTIN ) 300 mg capsule Take 300mg  at bedtime x 1 week, Take 300mg  in the AM and at bedtime x 1 week, Take 300mg  in the AM, in the evening, and at bedtime continue on this dose. (Patient taking differently: Take one capsule by mouth twice daily.)    lactobacillus rhamnosus GG (CULTURELLE) 10 billion cell capsule Take one capsule by mouth daily.    levothyroxine  (SYNTHROID ) 100 mcg tablet Take one tablet by mouth daily 30 minutes before breakfast.    methocarbamoL  (ROBAXIN ) 750 mg tablet Take one tablet by mouth twice daily.    methocarbamoL  (ROBAXIN ) 750 mg tablet Take one tablet by mouth every 8 hours as needed for Spasms. Indications: muscle spasm    montelukast  (SINGULAIR ) 10 mg tablet Take one tablet by mouth at bedtime daily.    multivitamin (ONE-A-DAY) tablet Take one tablet by mouth daily.    omeprazole  DR (PRILOSEC) 40 mg capsule TAKE ONE CAPSULE BY MOUTH EVERY DAY before breakfast, TAKE 30 MINUTES before a meal (Patient taking differently: Take one capsule by mouth every 48 hours.)    polyethylene glycol 3350  (MIRALAX ) 17 g packet Take one packet by mouth twice daily. Indications: constipation

## 2024-05-12 NOTE — Patient Instructions [37]
 Follow-Up:    -Thank you for allowing us  to participate in your care today. Your After Visit Summary is being completed by Rockey Fruits, RN.    -We would like you to follow up in 12 months with Jared Kvapil, MD  -The schedule is released approximately 4-5 months in advance. You will be called by our scheduling department to make an appointment and you will also receive a notification via MyChart to self-schedule.  However, if you would like to call to make this appointment, please call [phone].        Changes From Today's Office Visit     It was nice to see you today!     OK to STOP Zetia  at this time. We would recommend rechecking your cholesterol in 3-4 months after discontinuation.     If your LDL level is >100, we suggest a calcium scan. We can order this, just follow back up with our office.     2. Follow up in 12 months     Contacting our office:    -Business Hours: Monday-Friday, 8:00 am-4:30 pm (excluding Holidays).     -For medical questions or concerns, please send us  a message through your MyChart account or call the [facility] team nursing triage line at [phone]. Please leave a detailed message with your name, date of birth, and reason for your call.  If your message is received before 3:30pm, every effort will be made to call you back the same day.  Please allow time for us  to review your chart prior to call back.     -For medication refills please start by contacting your pharmacy. You can also send us  a prescription question through your MyChart or call the nurse triage line above.     -Please allow a minimum of 10-14 business days for clearance from your cardiologist for procedures and/or FMLA, disability, or  DOT forms. An office visit or testing may be required for us  to provide clearance.     [facility] nursing team fax number: [phone]    -You may receive a survey in the upcoming weeks from [facility]. Your feedback is important to us  and helps us  continue to improve patient care and patient satisfaction.     -Please feel free to call our Financial Department at [phone] with any questions or concerns about estimated cost of testing or imaging ordered today. We are happy to provide CPT codes upon request.    Results & Testing Follow Up:    -Please allow 5-7 business days for the results of any testing to be reviewed. Please call our office if you have not heard from a nurse within this time frame.    -Should you choose to complete testing at an outside facility, please contact our office after completion of testing so that we can ensure that we have received results for your provider to review.    Lab and test results:  As a part of the CARES act, starting 08/05/2019, some results will be released to you via MyChart immediately and automatically.  You may see results before your provider sees them; however, your provider will review all these results and then they, or one of their team, will notify you of result information and recommendations.   Critical results will be addressed immediately, but otherwise, please allow us  time to get back with you prior to you reaching out to us  for questions.  This will usually take about 72 hours for labs and  5-7 days for procedure test results.

## 2024-05-14 ENCOUNTER — Encounter: Admit: 2024-05-14 | Discharge: 2024-05-14 | Payer: BLUE CROSS/BLUE SHIELD

## 2024-05-17 ENCOUNTER — Encounter: Admit: 2024-05-17 | Discharge: 2024-05-17 | Payer: BLUE CROSS/BLUE SHIELD

## 2024-05-27 ENCOUNTER — Encounter: Admit: 2024-05-27 | Discharge: 2024-05-27 | Payer: BLUE CROSS/BLUE SHIELD

## 2024-05-27 DIAGNOSIS — Z981 Arthrodesis status: Principal | ICD-10-CM

## 2024-06-01 ENCOUNTER — Encounter: Admit: 2024-06-01 | Discharge: 2024-06-01 | Payer: BLUE CROSS/BLUE SHIELD

## 2024-06-01 ENCOUNTER — Ambulatory Visit: Admit: 2024-06-01 | Discharge: 2024-06-02 | Payer: BLUE CROSS/BLUE SHIELD

## 2024-06-01 ENCOUNTER — Ambulatory Visit: Admit: 2024-06-01 | Discharge: 2024-06-01 | Payer: BLUE CROSS/BLUE SHIELD

## 2024-06-01 VITALS — BP 110/58 | HR 62 | Ht 65.0 in | Wt 152.0 lb

## 2024-06-01 DIAGNOSIS — M48061 Spinal stenosis, lumbar region without neurogenic claudication: Principal | ICD-10-CM

## 2024-06-01 NOTE — Progress Notes [1]
 [facility]/Neurosurgery Postoperative Visit      Chief Compliant:   Chief Complaint   Patient presents with    Neck - Follow Up     30M PO - DOS - 02/16/24     DOS: 02/16/2024  Procedure: C4-7 ACDF     Subjective:    Danielle Garner is a 54 y.o. female who presents for scheduled 60-month postoperative visit. Reports doing well. Preoperative symptoms improving with time. Reports muscle tension in shoulders, currently in tax season and thinks that might be contributing. Awaiting PT assessment. Pain controlled with medications.  Denies incision issues.              Objective:          acetaminophen  (TYLENOL  EXTRA STRENGTH) 500 mg tablet Take two tablets by mouth every 6 hours as needed for Pain. Max of 4,000 mg of acetaminophen  in 24 hours.  Indications: pain    ALPRAZolam (XANAX) 0.25 mg tablet Take one tablet by mouth as Needed. Once every other week.    aluminum hydrox-magnesium  carb (GAVISCON EXTRA STRENGTH) 160-105 mg chewable tablet Chew one tablet by mouth as Needed.    baclofen  (LIORESAL ) 5 mg tablet Take one tablet by mouth every 8 hours as needed for Muscle Cramps.    Black Cohosh 540 mg cap Take one capsule by mouth at bedtime daily.    calcium carbonate-vitamin D3 (OS-CAL 500 + D) 1250 mg/200 unit tablet Take one tablet by mouth daily. Calcium Carb 1250mg  delivers 500mg  elemental Ca    desvenlafaxine succinate (PRISTIQ) 50 mg tablet Take one tablet by mouth daily.    docusate (COLACE) 100 mg capsule Take one capsule by mouth twice daily.    EPINEPHrine  (EPIPEN  2-PAK) 1 mg/mL injection pen (2-Pack) Inject 0.3 mg (1 Pen) into thigh if needed for anaphylactic reaction. May repeat in 5-15 minutes if needed.    estradioL (ESTRACE) 1 mg tablet Take one-half tablet by mouth daily.    famotidine  (PEPCID ) 40 mg tablet TAKE ONE TABLET BY MOUTH EVERY DAY 30 MINUTES before dinner    ferrous sulfate (FEOSOL) 325 mg (65 mg iron) tablet Take one tablet by mouth daily. Take on an empty stomach at least 1 hour before or 2 hours after food.    gabapentin  (NEURONTIN ) 300 mg capsule Take 300mg  at bedtime x 1 week, Take 300mg  in the AM and at bedtime x 1 week, Take 300mg  in the AM, in the evening, and at bedtime continue on this dose. (Patient taking differently: Take one capsule by mouth twice daily.)    lactobacillus rhamnosus GG (CULTURELLE) 10 billion cell capsule Take one capsule by mouth daily.    levothyroxine  (SYNTHROID ) 100 mcg tablet Take one tablet by mouth daily 30 minutes before breakfast.    methocarbamoL  (ROBAXIN ) 750 mg tablet Take one tablet by mouth twice daily.    methocarbamoL  (ROBAXIN ) 750 mg tablet Take one tablet by mouth every 8 hours as needed for Spasms. Indications: muscle spasm    montelukast  (SINGULAIR ) 10 mg tablet Take one tablet by mouth at bedtime daily.    multivitamin (ONE-A-DAY) tablet Take one tablet by mouth daily.    omeprazole  DR (PRILOSEC) 40 mg capsule TAKE ONE CAPSULE BY MOUTH EVERY DAY before breakfast, TAKE 30 MINUTES before a meal (Patient taking differently: Take one capsule by mouth every 48 hours.)    polyethylene glycol 3350  (MIRALAX ) 17 g packet Take one packet by mouth twice daily. Indications: constipation     Vitals:  06/01/24 1147   BP: 110/58   BP Source: Arm, Left Upper   Pulse: 62   SpO2: 100%   PainSc: Zero   Weight: 68.9 kg (152 lb)   Height: 165.1 cm (5' 5)     Body mass index is 25.29 kg/m?Danielle Garner     Physical Exam  Awake, alert  Oriented to person, place, setting   CN II-XII grossly intact   Motor strength 5/5 in all muscle groups   SILT   Gait normal   Left anterior neck incision well healed without erythema or dehiscence     Imaging:  Cervical   AP/L films reviewed. Show stable placement of hardware at C4-7 without surrounding hardware lucency or fracture.     MRI lumbar spine without contrast 10/30/2023 reviewed.  L4-5 small central disc protrusion.  There is mild central canal stenosis.  L5-S1 chronic degenerative disease with prior postsurgical changes and right hemilaminotomy.  Mild circumferential disc bulge.  Mild left foraminal stenosis.       Assessment and Plan:  Danielle Garner is a 54 y.o. female who presents for 6-week postoperative visit.  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 Overall, doing well. Preoperative symptoms improving, patient pleased with surgical outcome. Incision healing well.  Reviewed recent films, demonstrate stable hardware placement.  No formal restrictions at this timeframe.  Encourage patient to increase activity as tolerated.  Regarding intermittent lumbar symptoms I provided prescription for formal physical therapy.  We discussed pain management evaluation if her symptoms are refractory to physical therapy.    Return to clinic in 3 months with updated cervical dynamic films stable appointment.  Advised patient to call with questions/concerns as they arise.     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 Delmer, MD  Neurosurgery, Assistant Professor
